# Patient Record
Sex: Female | Born: 1965 | Race: Black or African American | Hispanic: No | Smoking: Never smoker
Health system: Southern US, Community
[De-identification: ages and names within clinical notes are randomized; demographics above are authoritative.]

## PROBLEM LIST (undated history)

## (undated) DIAGNOSIS — Z6833 Body mass index (BMI) 33.0-33.9, adult: Secondary | ICD-10-CM

## (undated) HISTORY — PX: LAPAROSCOPY: SHX197

## (undated) HISTORY — DX: Body mass index (bmi) 33.0-33.9, adult: Z68.33

---

## 2003-01-27 HISTORY — PX: TUBAL LIGATION: SHX77

## 2005-10-12 ENCOUNTER — Ambulatory Visit: Payer: Self-pay | Admitting: Family Medicine

## 2005-11-03 DIAGNOSIS — E669 Obesity, unspecified: Secondary | ICD-10-CM

## 2005-12-24 ENCOUNTER — Encounter: Payer: Self-pay | Admitting: Family Medicine

## 2005-12-24 ENCOUNTER — Ambulatory Visit: Payer: Self-pay | Admitting: Family Medicine

## 2005-12-24 ENCOUNTER — Other Ambulatory Visit: Admission: RE | Admit: 2005-12-24 | Discharge: 2005-12-24 | Payer: Self-pay | Admitting: Family Medicine

## 2005-12-24 LAB — CONVERTED CEMR LAB: Pap Smear: NEGATIVE

## 2005-12-29 ENCOUNTER — Encounter: Payer: Self-pay | Admitting: Family Medicine

## 2006-01-21 ENCOUNTER — Encounter: Payer: Self-pay | Admitting: Family Medicine

## 2006-01-21 ENCOUNTER — Telehealth: Payer: Self-pay | Admitting: Family Medicine

## 2006-03-24 ENCOUNTER — Ambulatory Visit: Payer: Self-pay | Admitting: Family Medicine

## 2006-03-24 DIAGNOSIS — N939 Abnormal uterine and vaginal bleeding, unspecified: Secondary | ICD-10-CM

## 2006-03-24 DIAGNOSIS — N926 Irregular menstruation, unspecified: Secondary | ICD-10-CM | POA: Insufficient documentation

## 2006-12-15 ENCOUNTER — Ambulatory Visit: Payer: Self-pay | Admitting: Family Medicine

## 2006-12-15 DIAGNOSIS — N951 Menopausal and female climacteric states: Secondary | ICD-10-CM | POA: Insufficient documentation

## 2006-12-15 DIAGNOSIS — N912 Amenorrhea, unspecified: Secondary | ICD-10-CM | POA: Insufficient documentation

## 2006-12-15 LAB — CONVERTED CEMR LAB: Beta hcg, urine, semiquantitative: NEGATIVE

## 2006-12-29 ENCOUNTER — Other Ambulatory Visit: Admission: RE | Admit: 2006-12-29 | Discharge: 2006-12-29 | Payer: Self-pay | Admitting: Family Medicine

## 2006-12-29 ENCOUNTER — Encounter: Payer: Self-pay | Admitting: Family Medicine

## 2006-12-29 ENCOUNTER — Ambulatory Visit: Payer: Self-pay | Admitting: Family Medicine

## 2006-12-29 DIAGNOSIS — D239 Other benign neoplasm of skin, unspecified: Secondary | ICD-10-CM | POA: Insufficient documentation

## 2006-12-29 DIAGNOSIS — R5383 Other fatigue: Secondary | ICD-10-CM

## 2006-12-29 DIAGNOSIS — R5381 Other malaise: Secondary | ICD-10-CM

## 2006-12-29 LAB — CONVERTED CEMR LAB
AST: 15 units/L (ref 0–37)
Albumin: 3.9 g/dL (ref 3.5–5.2)
BUN: 14 mg/dL (ref 6–23)
CO2: 21 meq/L (ref 19–32)
Calcium: 8.9 mg/dL (ref 8.4–10.5)
Chloride: 105 meq/L (ref 96–112)
Cholesterol: 119 mg/dL (ref 0–200)
Creatinine, Ser: 0.81 mg/dL (ref 0.40–1.20)
HCT: 42.4 % (ref 36.0–46.0)
HDL: 57 mg/dL (ref >39)
Hemoglobin: 12.7 g/dL (ref 12.0–15.0)
Potassium: 4.6 meq/L (ref 3.5–5.3)
RBC: 5.06 M/uL (ref 3.87–5.11)
RDW: 14.4 % (ref 11.5–15.5)
TSH: 1.014 microintl units/mL (ref 0.350–5.50)
Total CHOL/HDL Ratio: 2.1
WBC: 7.1 10*3/uL (ref 4.0–10.5)

## 2006-12-30 ENCOUNTER — Encounter: Payer: Self-pay | Admitting: Family Medicine

## 2007-01-26 ENCOUNTER — Encounter: Payer: Self-pay | Admitting: Family Medicine

## 2007-02-04 ENCOUNTER — Telehealth (INDEPENDENT_AMBULATORY_CARE_PROVIDER_SITE_OTHER): Payer: Self-pay | Admitting: *Deleted

## 2007-02-16 ENCOUNTER — Encounter: Payer: Self-pay | Admitting: Family Medicine

## 2008-01-05 ENCOUNTER — Other Ambulatory Visit: Admission: RE | Admit: 2008-01-05 | Discharge: 2008-01-05 | Payer: Self-pay | Admitting: Family Medicine

## 2008-01-05 ENCOUNTER — Encounter: Payer: Self-pay | Admitting: Family Medicine

## 2008-01-05 ENCOUNTER — Ambulatory Visit: Payer: Self-pay | Admitting: Family Medicine

## 2008-01-05 LAB — CONVERTED CEMR LAB
ALT: 13 units/L (ref 0–35)
CO2: 21 meq/L (ref 19–32)
Calcium: 8.2 mg/dL — ABNORMAL LOW (ref 8.4–10.5)
Chloride: 106 meq/L (ref 96–112)
Cholesterol: 115 mg/dL (ref 0–200)
Creatinine, Ser: 0.77 mg/dL (ref 0.40–1.20)
Glucose, Bld: 88 mg/dL (ref 70–99)
Sodium: 139 meq/L (ref 135–145)
Total Protein: 7 g/dL (ref 6.0–8.3)
Triglycerides: 56 mg/dL (ref <150)

## 2008-01-09 ENCOUNTER — Encounter: Payer: Self-pay | Admitting: Family Medicine

## 2009-01-07 ENCOUNTER — Encounter: Admission: RE | Admit: 2009-01-07 | Discharge: 2009-01-07 | Payer: Self-pay | Admitting: Family Medicine

## 2009-01-07 ENCOUNTER — Other Ambulatory Visit: Admission: RE | Admit: 2009-01-07 | Discharge: 2009-01-07 | Payer: Self-pay | Admitting: Family Medicine

## 2009-01-07 ENCOUNTER — Ambulatory Visit: Payer: Self-pay | Admitting: Family Medicine

## 2009-01-07 DIAGNOSIS — R635 Abnormal weight gain: Secondary | ICD-10-CM | POA: Insufficient documentation

## 2009-01-08 LAB — CONVERTED CEMR LAB
Albumin: 4 g/dL (ref 3.5–5.2)
BUN: 12 mg/dL (ref 6–23)
Calcium: 9 mg/dL (ref 8.4–10.5)
Chloride: 107 meq/L (ref 96–112)
Cholesterol: 118 mg/dL (ref 0–200)
Creatinine, Ser: 0.79 mg/dL (ref 0.40–1.20)
Glucose, Bld: 88 mg/dL (ref 70–99)
HDL: 49 mg/dL (ref >39)
Potassium: 4.3 meq/L (ref 3.5–5.3)
Total CHOL/HDL Ratio: 2.4
Triglycerides: 61 mg/dL (ref <150)

## 2010-01-08 ENCOUNTER — Encounter
Admission: RE | Admit: 2010-01-08 | Discharge: 2010-01-08 | Payer: Self-pay | Source: Home / Self Care | Attending: Family Medicine | Admitting: Family Medicine

## 2010-01-08 ENCOUNTER — Encounter: Payer: Self-pay | Admitting: Family Medicine

## 2010-01-08 ENCOUNTER — Other Ambulatory Visit
Admission: RE | Admit: 2010-01-08 | Discharge: 2010-01-08 | Payer: Self-pay | Source: Home / Self Care | Admitting: Family Medicine

## 2010-01-08 ENCOUNTER — Ambulatory Visit: Payer: Self-pay | Admitting: Family Medicine

## 2010-01-09 LAB — CONVERTED CEMR LAB
AST: 15 units/L (ref 0–37)
Albumin: 3.9 g/dL (ref 3.5–5.2)
BUN: 14 mg/dL (ref 6–23)
CO2: 26 meq/L (ref 19–32)
Calcium: 8.8 mg/dL (ref 8.4–10.5)
Chloride: 106 meq/L (ref 96–112)
Cholesterol: 122 mg/dL (ref 0–200)
Glucose, Bld: 94 mg/dL (ref 70–99)
HDL: 61 mg/dL (ref >39)
Potassium: 4.8 meq/L (ref 3.5–5.3)
TSH: 0.997 microintl units/mL (ref 0.350–4.500)

## 2010-02-27 NOTE — Assessment & Plan Note (Signed)
Summary: CPE with pap   Vital Signs:  Patient profile:   45 year old female Menstrual status:  irregular Height:      68 inches Weight:      208 pounds BMI:     31.74 O2 Sat:      96 % on Room air Pulse rate:   85 / minute BP sitting:   123 / 81  (left arm) Cuff size:   large  Vitals Entered By: Payton Spark CMA (January 08, 2010 8:43 AM)  O2 Flow:  Room air CC: CPE w/ pap   Primary Care Provider:  Seymour Bars DO  CC:  CPE w/ pap.  History of Present Illness: 45 yo G1P1 perimenopausal female presents for CPE.  Not in any relationships.  Due for pap smear.  Has been forgetting to take her Apri everyday.  Periods are starting to skip 1-2 mos.  Denies heavy periods or lasting > 7 days.  Her mammogram is scheduled today.  Denies fam hx of premature heart dz, colon cancer or breast cancer.  Due for fasting labs.  Thinks her Tdap was done < 10 yrs ago.  Wants flu shot today.  Current Medications (verified): 1)  Apri 0.15-30 Mg-Mcg Tabs (Desogestrel-Ethinyl Estradiol) .Marland Kitchen.. 1 Tab By Mouth Daily As Directed  Allergies (verified): No Known Drug Allergies  Past History:  Past Medical History: Reviewed history from 11/03/2005 and no changes required. G4P1 -- 1 tubal in 2005  Family History: Reviewed history from 12/24/2005 and no changes required. 1 brother and 1 sister alive and healthy  brother died in MVA father died GSW, mother died in MVA  Social History: Reviewed history from 11/03/2005 and no changes required. 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  Has Masters.  Married to [place].  1 daugther in college.  Nonsmoker. Occas ETOH. Does not exercise.  Wt steadily increasing. Eats a lot of fast food.  Review of Systems  The patient denies anorexia, fever, weight loss, weight gain, vision loss, decreased hearing, hoarseness, chest pain, syncope, dyspnea on exertion, peripheral edema, prolonged cough, headaches, hemoptysis, abdominal pain, melena, hematochezia, severe  indigestion/heartburn, hematuria, incontinence, genital sores, muscle weakness, suspicious skin lesions, transient blindness, difficulty walking, depression, unusual weight change, abnormal bleeding, enlarged lymph nodes, angioedema, breast masses, and testicular masses.    Physical Exam  General:  alert, well-developed, well-nourished, and well-hydrated.  obese Head:  normocephalic and atraumatic.   Eyes:  pupils equal, pupils round, and pupils reactive to light.   Ears:  no external deformities.   Nose:  no nasal discharge.   Mouth:  good dentition and pharynx pink and moist.   Neck:  no masses.   Breasts:  No mass, nodules, thickening, tenderness, bulging, retraction, inflamation, nipple discharge or skin changes noted.   Lungs:  Normal respiratory effort, chest expands symmetrically. Lungs are clear to auscultation, no crackles or wheezes. Heart:  Normal rate and regular rhythm. S1 and S2 normal without gallop, murmur, click, rub or other extra sounds. Abdomen:  Bowel sounds positive,abdomen soft and non-tender without masses, organomegaly Genitalia:  Pelvic Exam:        External: normal female genitalia without lesions or masses        Vagina: normal without lesions or masses        Cervix: normal without lesions or masses        Adnexa: normal bimanual exam without masses or fullness        Uterus: normal by palpation  Pap smear: performed Pulses:  2+ radial and pedal pulses Extremities:  no LE edema Skin:  hirsuit, color normal and no edema.   Cervical Nodes:  No lymphadenopathy noted Psych:  good eye contact, not anxious appearing, and not depressed appearing.     Impression & Recommendations:  Problem # 1:  ROUTINE GYNECOLOGICAL EXAMINATION (ICD-V72.31) Keeping healthy checklist for women reviewed. BP at goal. BMI 31 c/w class I obesity. Mammogram scheduled for today. Fasitng labs today. Thin prep pap today. Keeping healthy checklist for women reviewed. Tdap is  historically UTD. Flu shot given. Stop OCPs -- forgetting to take, not having heavy periods and is not sexually active.  Complete Medication List: 1)  Apri 0.15-30 Mg-mcg Tabs (Desogestrel-ethinyl estradiol) .Marland Kitchen.. 1 tab by mouth daily as directed  Other Orders: T-Comprehensive Metabolic Panel [id number]) T-Lipid Profile [id number]) T-TSH [id number]) Admin 1st Vaccine (41324) Flu Vaccine 51yrs + (40102)  Patient Instructions: 1)  Have mammogram and fasting labs drawn. 2)  Will call you w/ results tomorrow. 3)  Stop Birth Control Pills. 4)  Call if periods become heavy or last > 7 days. 5)  Return for next PHYSICAL in 1 yr, sooner if needed for anything.   Orders Added: 1)  T-Comprehensive Metabolic Panel [[id number]] 2)  T-Lipid Profile [[id number]] 3)  T-TSH [[id number]] 4)  Est. Patient age 64-64 [69] 5)  Admin 1st Vaccine [90471] 6)  Flu Vaccine 59yrs + [47425] Flu Vaccine Consent Questions     Do you have a history of severe allergic reactions to this vaccine? no    Any prior history of allergic reactions to egg and/or gelatin? no    Do you have a sensitivity to the preservative Thimersol? no    Do you have a past history of Guillan-Barre Syndrome? no    Do you currently have an acute febrile illness? no    Have you ever had a severe reaction to latex? no    Vaccine information given and explained to patient? yes    Are you currently pregnant? no    Lot Number:[id number]   Exp Date:07/26/2010   Site Given  Left Deltoid IM 11yrs + [95638]     .lbflu

## 2010-04-23 ENCOUNTER — Other Ambulatory Visit: Payer: Self-pay | Admitting: Family Medicine

## 2010-04-23 DIAGNOSIS — Z309 Encounter for contraceptive management, unspecified: Secondary | ICD-10-CM

## 2010-04-23 NOTE — Telephone Encounter (Signed)
Pt tried to DC OCPs but prefers to be on them. Pt requests refill.

## 2010-12-01 ENCOUNTER — Other Ambulatory Visit: Payer: Self-pay | Admitting: Family Medicine

## 2010-12-01 DIAGNOSIS — Z1231 Encounter for screening mammogram for malignant neoplasm of breast: Secondary | ICD-10-CM

## 2011-01-08 ENCOUNTER — Encounter: Payer: Self-pay | Admitting: Family Medicine

## 2011-01-12 ENCOUNTER — Encounter: Payer: Self-pay | Admitting: Family Medicine

## 2011-01-13 ENCOUNTER — Ambulatory Visit (INDEPENDENT_AMBULATORY_CARE_PROVIDER_SITE_OTHER): Payer: BC Managed Care – PPO | Admitting: Family Medicine

## 2011-01-13 ENCOUNTER — Encounter: Payer: Self-pay | Admitting: Family Medicine

## 2011-01-13 ENCOUNTER — Ambulatory Visit
Admission: RE | Admit: 2011-01-13 | Discharge: 2011-01-13 | Disposition: A | Discharge status: Home / Self Care | Payer: Self-pay | Source: Ambulatory Visit | Attending: Family Medicine | Admitting: Family Medicine

## 2011-01-13 ENCOUNTER — Other Ambulatory Visit (HOSPITAL_COMMUNITY)
Admission: RE | Admit: 2011-01-13 | Discharge: 2011-01-13 | Disposition: A | Discharge status: Home / Self Care | Payer: BC Managed Care – PPO | Source: Ambulatory Visit | Attending: Family Medicine | Admitting: Family Medicine

## 2011-01-13 VITALS — BP 134/84 | HR 79 | Ht 68.0 in | Wt 197.0 lb

## 2011-01-13 DIAGNOSIS — Z1231 Encounter for screening mammogram for malignant neoplasm of breast: Secondary | ICD-10-CM

## 2011-01-13 DIAGNOSIS — Z309 Encounter for contraceptive management, unspecified: Secondary | ICD-10-CM

## 2011-01-13 DIAGNOSIS — Z1159 Encounter for screening for other viral diseases: Secondary | ICD-10-CM | POA: Insufficient documentation

## 2011-01-13 DIAGNOSIS — Z01419 Encounter for gynecological examination (general) (routine) without abnormal findings: Secondary | ICD-10-CM

## 2011-01-13 LAB — LIPID PANEL
Total CHOL/HDL Ratio: 1.8 Ratio
VLDL: 17 mg/dL (ref 0–40)

## 2011-01-13 LAB — COMPLETE METABOLIC PANEL WITH GFR
AST: 13 U/L (ref 0–37)
Albumin: 3.6 g/dL (ref 3.5–5.2)
Alkaline Phosphatase: 66 U/L (ref 39–117)
Chloride: 106 mEq/L (ref 96–112)
GFR, Est Non African American: 87 mL/min
Glucose, Bld: 87 mg/dL (ref 70–99)
Potassium: 4.6 mEq/L (ref 3.5–5.3)
Sodium: 139 mEq/L (ref 135–145)
Total Protein: 7.1 g/dL (ref 6.0–8.3)

## 2011-01-13 MED ORDER — DESOGESTREL-ETHINYL ESTRADIOL 0.15-30 MG-MCG PO TABS: 1.0000 | ORAL_TABLET | Freq: Every day | ORAL | Status: DC

## 2011-01-13 NOTE — Progress Notes (Signed)
  Subjective:     Courtney Daniels is a 45 y.o. female and is here for a comprehensive physical exam. The patient reports no problems.  History   Social History  . Marital Status: Married    Spouse Name: N/A    Number of Children: 1  . Years of Education: MA   Occupational History  . Quality development specialist    Social History Main Topics  . Smoking status: Never Smoker   . Smokeless tobacco: Not on file  . Alcohol Use: 1.0 oz/week    2 drink(s) per week  . Drug Use: No  . Sexually Active: Not on file   Other Topics Concern  . Not on file   Social History Narrative   Divorced from [place].  No exercise.     Health Maintenance  Topic Date Due  . Influenza Vaccine  10/27/2011  . Pap Smear  01/08/2013  . Tetanus/tdap  01/26/2014    The following portions of the patient's history were reviewed and updated as appropriate: allergies, current medications, past family history, past medical history, past social history, past surgical history and problem list.  Review of Systems A comprehensive review of systems was negative.   Objective:    BP 134/84  Pulse 79  Ht 5\' 8"  (1.727 m)  Wt 197 lb (89.359 kg)  BMI 29.95 kg/m2  LMP 12/24/2010 General appearance: alert, cooperative and appears stated age Head: Normocephalic, without obvious abnormality, atraumatic Eyes: conj clear, EOMi, PEERLA Ears: normal TM's and external ear canals both ears Nose: Nares normal. Septum midline. Mucosa normal. No drainage or sinus tenderness. Throat: lips, mucosa, and tongue normal; teeth and gums normal Neck: no adenopathy, no carotid bruit, no JVD, supple, symmetrical, trachea midline and thyroid not enlarged, symmetric, no tenderness/mass/nodules Back: symmetric, no curvature. ROM normal. No CVA tenderness. Lungs: clear to auscultation bilaterally Breasts: normal appearance, no masses or tenderness Heart: regular rate and rhythm, S1, S2 normal, no murmur, click, rub or gallop Abdomen:  soft, non-tender; bowel sounds normal; no masses,  no organomegaly Pelvic: cervix normal in appearance, external genitalia normal, no adnexal masses or tenderness, no cervical motion tenderness, rectovaginal septum normal, uterus normal size, shape, and consistency and vagina normal without discharge Extremities: extremities normal, atraumatic, no cyanosis or edema Pulses: 2+ and symmetric Skin: Skin color, texture, turgor normal. No rashes or lesions Lymph nodes: Cervical, supraclavicular, and axillary nodes normal. Neurologic: Grossly normal    Assessment:    Healthy female exam.      Plan:     See After Visit Summary for Counseling Recommendations  Start a regular exercise program and make sure you are eating a healthy diet Try to eat 4 servings of dairy a day or take a calcium supplement (500mg  twice a day). Your vaccines are up to date.  REfilled meds Due fore screening labs.

## 2011-01-13 NOTE — Patient Instructions (Signed)
Start a regular exercise program and make sure you are eating a healthy diet Try to eat 4 servings of dairy a day or take a calcium supplement (500mg twice a day). Your vaccines are up to date.   

## 2011-12-18 ENCOUNTER — Other Ambulatory Visit: Payer: Self-pay | Admitting: Family Medicine

## 2011-12-18 DIAGNOSIS — Z139 Encounter for screening, unspecified: Secondary | ICD-10-CM

## 2012-01-15 ENCOUNTER — Ambulatory Visit (INDEPENDENT_AMBULATORY_CARE_PROVIDER_SITE_OTHER): Payer: BC Managed Care – PPO | Admitting: Family Medicine

## 2012-01-15 ENCOUNTER — Encounter: Payer: Self-pay | Admitting: Family Medicine

## 2012-01-15 VITALS — BP 144/93 | HR 87 | Ht 67.0 in | Wt 204.0 lb

## 2012-01-15 DIAGNOSIS — Z23 Encounter for immunization: Secondary | ICD-10-CM

## 2012-01-15 DIAGNOSIS — Z0000 Encounter for general adult medical examination without abnormal findings: Secondary | ICD-10-CM

## 2012-01-15 DIAGNOSIS — Z202 Contact with and (suspected) exposure to infections with a predominantly sexual mode of transmission: Secondary | ICD-10-CM

## 2012-01-15 NOTE — Addendum Note (Signed)
Addended by: Judie Petit A on: 01/15/2012 03:57 PM   Modules accepted: Orders

## 2012-01-15 NOTE — Progress Notes (Signed)
  Subjective:     Courtney Daniels is a 46 y.o. female and is here for a comprehensive physical exam. The patient reports no problems. Says plasn on joining the gym and wants to lose about 50 lbs.    History   Social History  . Marital Status: Married    Spouse Name: N/A    Number of Children: 1  . Years of Education: MA   Occupational History  . Quality development specialist    Social History Main Topics  . Smoking status: Never Smoker   . Smokeless tobacco: Not on file  . Alcohol Use: 1.0 oz/week    2 drink(s) per week  . Drug Use: No  . Sexually Active: Not on file   Other Topics Concern  . Not on file   Social History Narrative   Divorced from [place].  No exercise.     Health Maintenance  Topic Date Due  . Influenza Vaccine  09/26/2012  . Pap Smear  01/12/2014  . Tetanus/tdap  01/26/2014    The following portions of the patient's history were reviewed and updated as appropriate: allergies, current medications, past family history, past medical history, past social history, past surgical history and problem list.  Review of Systems A comprehensive review of systems was negative.   Objective:    BP 144/93  Pulse 87  Ht 5\' 7"  (1.702 m)  Wt 204 lb (92.534 kg)  BMI 31.95 kg/m2 General appearance: alert, cooperative and appears stated age Head: Normocephalic, without obvious abnormality, atraumatic Eyes: conj clear, EOMi, PEERLa Ears: normal TM's and external ear canals both ears Nose: Nares normal. Septum midline. Mucosa normal. No drainage or sinus tenderness. Throat: lips, mucosa, and tongue normal; teeth and gums normal Neck: no adenopathy, no carotid bruit, no JVD, supple, symmetrical, trachea midline and thyroid not enlarged, symmetric, no tenderness/mass/nodules Back: symmetric, no curvature. ROM normal. No CVA tenderness. Lungs: clear to auscultation bilaterally Breasts: normal appearance, no masses or tenderness Heart: regular rate and rhythm, S1, S2 normal,  no murmur, click, rub or gallop Abdomen: soft, non-tender; bowel sounds normal; no masses,  no organomegaly Extremities: extremities normal, atraumatic, no cyanosis or edema Pulses: 2+ and symmetric Skin: Skin color, texture, turgor normal. No rashes or lesions Lymph nodes: Cervical, supraclavicular, and axillary nodes normal. Neurologic: Alert and oriented X 3, normal strength and tone. Normal symmetric reflexes. Normal coordination and gait     Assessment:    Healthy female exam.      Plan:     See After Visit Summary for Counseling Recommendations  Keep up a regular exercise program and make sure you are eating a healthy diet Try to eat 4 servings of dairy a day, or if you are lactose intolerant take a calcium with vitamin D daily.  Your vaccines are up to date.  Mammogram is scheduled for Monday. Given lab slip for CMP and fasting lipid panel that she can have done next week when she is fasting. Vaccine given today. Pap smear is up-to-date. We'll repeat next year. Did also do STD testing should she has a new sexual partner.

## 2012-01-15 NOTE — Patient Instructions (Signed)
Keep up a regular exercise program and make sure you are eating a healthy diet Try to eat 4 servings of dairy a day, or if you are lactose intolerant take a calcium with vitamin D daily.  Your vaccines are up to date.   

## 2012-01-19 ENCOUNTER — Ambulatory Visit (INDEPENDENT_AMBULATORY_CARE_PROVIDER_SITE_OTHER): Payer: BC Managed Care – PPO

## 2012-01-19 DIAGNOSIS — Z139 Encounter for screening, unspecified: Secondary | ICD-10-CM

## 2012-01-19 DIAGNOSIS — Z1231 Encounter for screening mammogram for malignant neoplasm of breast: Secondary | ICD-10-CM

## 2012-01-20 LAB — LIPID PANEL
Total CHOL/HDL Ratio: 2 Ratio
VLDL: 13 mg/dL (ref 0–40)

## 2012-01-20 LAB — COMPLETE METABOLIC PANEL WITH GFR
AST: 18 U/L (ref 0–37)
Albumin: 3.9 g/dL (ref 3.5–5.2)
Alkaline Phosphatase: 80 U/L (ref 39–117)
Potassium: 4.1 mEq/L (ref 3.5–5.3)
Sodium: 137 mEq/L (ref 135–145)
Total Protein: 6.8 g/dL (ref 6.0–8.3)

## 2012-01-20 LAB — RPR

## 2012-01-21 LAB — GC/CHLAMYDIA PROBE AMP, URINE: Chlamydia, Swab/Urine, PCR: NEGATIVE

## 2012-10-28 ENCOUNTER — Ambulatory Visit (INDEPENDENT_AMBULATORY_CARE_PROVIDER_SITE_OTHER): Payer: BC Managed Care – PPO | Admitting: Family Medicine

## 2012-10-28 ENCOUNTER — Other Ambulatory Visit (HOSPITAL_COMMUNITY)
Admission: RE | Admit: 2012-10-28 | Discharge: 2012-10-28 | Disposition: A | Discharge status: Home / Self Care | Payer: BC Managed Care – PPO | Source: Ambulatory Visit | Attending: Family Medicine | Admitting: Family Medicine

## 2012-10-28 ENCOUNTER — Encounter: Payer: Self-pay | Admitting: Family Medicine

## 2012-10-28 VITALS — BP 129/79 | HR 65 | Wt 210.0 lb

## 2012-10-28 DIAGNOSIS — Z113 Encounter for screening for infections with a predominantly sexual mode of transmission: Secondary | ICD-10-CM | POA: Insufficient documentation

## 2012-10-28 DIAGNOSIS — Z01419 Encounter for gynecological examination (general) (routine) without abnormal findings: Secondary | ICD-10-CM | POA: Insufficient documentation

## 2012-10-28 DIAGNOSIS — N920 Excessive and frequent menstruation with regular cycle: Secondary | ICD-10-CM

## 2012-10-28 DIAGNOSIS — N926 Irregular menstruation, unspecified: Secondary | ICD-10-CM

## 2012-10-28 NOTE — Progress Notes (Signed)
  Subjective:    Patient ID: Courtney Daniels, female    DOB: 06-23-1965, 47 y.o.   MRN: [id number]  HPI Her  Periods are getting heavier and longer. She is now passing clots.  Has happened with the last 2 periods.  She says period lasted 8-9 days. Normally only last 4 days.   Bother sisters went through menopause in their 76s. Never smoker. Starter her OCP about a year ago and periods have been irregular since then.  LMP was 10/16/12. Only stopped for about 2 days and then started again.  Last pap was 2 yr ago in 12/2010.  No new sexual partner. No vaginal d/c, odor or pelvic pain.  No worsening or alleviating factors. No significant pelvic discomfort or pain.   Review of Systems  BP 129/79  Pulse 65  Wt 210 lb (95.255 kg)  BMI 32.88 kg/m2  LMP 10/16/2012    No Known Allergies  Past Medical History  Diagnosis Date  . Ectopic pregnancy 2005    Past Surgical History  Procedure Laterality Date  . Tubal ligation  2005    Left only    History   Social History  . Marital Status: Married    Spouse Name: N/A    Number of Children: 1  . Years of Education: MA   Occupational History  . Quality development specialist    Social History Main Topics  . Smoking status: Never Smoker   . Smokeless tobacco: Not on file  . Alcohol Use: 1.0 oz/week    2 drink(s) per week  . Drug Use: No  . Sexual Activity: Not on file   Other Topics Concern  . Not on file   Social History Narrative   Divorced from [place].  No exercise.      Family History  Problem Relation Age of Onset  . Cataracts Maternal Grandmother   . Diabetes Maternal Aunt   . Diabetes      fathers side    No outpatient encounter prescriptions on file as of 10/28/2012.   No facility-administered encounter medications on file as of 10/28/2012.          Objective:   Physical Exam  Constitutional: She is oriented to person, place, and time. She appears well-developed and well-nourished.  HENT:  Head: Normocephalic  and atraumatic.  Genitourinary: Vagina normal and uterus normal. There is no rash, tenderness, lesion or injury on the right labia. There is no rash, tenderness, lesion or injury on the left labia. Cervix exhibits no motion tenderness, no discharge and no friability. Right adnexum displays no mass, no tenderness and no fullness. Left adnexum displays no mass, no tenderness and no fullness.  Neurological: She is alert and oriented to person, place, and time.  Skin: Skin is warm and dry.  Psychiatric: She has a normal mood and affect. Her behavior is normal.          Assessment & Plan:  Menorrhagia - Suspect may be perimenopausal .  Thyroid as well as hormones to evaluate where she may be as far as menopause is concerned. She would like to consider going back on birth control for a short period time just reregulate her cycle. I think that that's not unreasonable. She's never been a smoker. Pap smear performed. We'll call with results once available. Also did a wet prep. GC and Chlamydia performed. Consider pelvic ultrasound if the heavy bleeding continues. So far it only been with 2 cycles. She is a never smoker.

## 2012-10-29 LAB — WET PREP, GENITAL
Clue Cells Wet Prep HPF POC: NONE SEEN
WBC, Wet Prep HPF POC: NONE SEEN

## 2012-10-29 LAB — LUTEINIZING HORMONE: LH: 23.8 m[IU]/mL

## 2012-10-31 ENCOUNTER — Other Ambulatory Visit: Payer: Self-pay | Admitting: Family Medicine

## 2012-10-31 MED ORDER — FLUCONAZOLE 150 MG PO TABS: 150.0000 mg | ORAL_TABLET | Freq: Every day | ORAL | Status: DC

## 2013-01-06 ENCOUNTER — Other Ambulatory Visit: Payer: Self-pay | Admitting: Family Medicine

## 2013-01-06 DIAGNOSIS — Z1231 Encounter for screening mammogram for malignant neoplasm of breast: Secondary | ICD-10-CM

## 2013-01-24 ENCOUNTER — Ambulatory Visit (INDEPENDENT_AMBULATORY_CARE_PROVIDER_SITE_OTHER): Payer: BC Managed Care – PPO

## 2013-01-24 ENCOUNTER — Ambulatory Visit (INDEPENDENT_AMBULATORY_CARE_PROVIDER_SITE_OTHER): Payer: BC Managed Care – PPO | Admitting: Family Medicine

## 2013-01-24 ENCOUNTER — Encounter: Payer: Self-pay | Admitting: Family Medicine

## 2013-01-24 VITALS — BP 130/85 | HR 69 | Temp 97.7°F | Ht 67.0 in | Wt 215.0 lb

## 2013-01-24 DIAGNOSIS — Z1231 Encounter for screening mammogram for malignant neoplasm of breast: Secondary | ICD-10-CM

## 2013-01-24 DIAGNOSIS — Z0000 Encounter for general adult medical examination without abnormal findings: Secondary | ICD-10-CM

## 2013-01-24 NOTE — Patient Instructions (Signed)
Silicone sheets or gel for the keloid. Can treat for 24 weeks.   Keep up a regular exercise program and make sure you are eating a healthy diet Try to eat 4 servings of dairy a day, or if you are lactose intolerant take a calcium with vitamin D daily.  Your vaccines are up to date.

## 2013-01-24 NOTE — Progress Notes (Signed)
  Subjective:     Courtney Daniels is a 47 y.o. female and is here for a comprehensive physical exam. 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 started about na year ago after had a pimple that wouldn't heal.  Had mammo today.   History   Social History  . Marital Status: Married    Spouse Name: N/A    Number of Children: 1  . Years of Education: MA   Occupational History  . Quality development specialist    Social History Main Topics  . Smoking status: Never Smoker   . Smokeless tobacco: Not on file  . Alcohol Use: 1.0 oz/week    2 drink(s) per week  . Drug Use: No  . Sexual Activity: Not on file   Other Topics Concern  . Not on file   Social History Narrative   Divorced from [place].  No exercise.     Health Maintenance  Topic Date Due  . Influenza Vaccine  08/26/2013  . Tetanus/tdap  01/26/2014  . Pap Smear  10/29/2015    The following portions of the patient's history were reviewed and updated as appropriate: allergies, current medications, past family history, past medical history, past social history, past surgical history and problem list.  Review of Systems A comprehensive review of systems was negative.   Objective:    BP 130/85  Pulse 69  Temp(Src) 97.7 F (36.5 C)  Ht 5\' 7"  (1.702 m)  Wt 215 lb (97.523 kg)  BMI 33.67 kg/m2  LMP 01/16/2013 General appearance: alert, cooperative and appears stated age Head: Normocephalic, without obvious abnormality, atraumatic Eyes: conj clear, EOMI, PEERLA Ears: normal TM's and external ear canals both ears Nose: Nares normal. Septum midline. Mucosa normal. No drainage or sinus tenderness. Throat: lips, mucosa, and tongue normal; teeth and gums normal  Cardio: RRR, no murmur Lung: CTA-B Abd: soft, NT, no OGM Extremity: no LE edema, clubbing or cyanosis Neuro: CN 2-12 in tact. Gait is normal. Reflexes 2+ in UE nad LE.  Breast and Pelvic exam not performed    Assessment:    Healthy female  exam.      Plan:     See After Visit Summary for Counseling Recommendations  Keep up a regular exercise program and make sure you are eating a healthy diet Try to eat 4 servings of dairy a day, or if you are lactose intolerant take a calcium with vitamin D daily.  Your vaccines are up to date.  Due for CMP and lipids.   Keloid - discussed tx with silicone sheets or gel at one of most effective tx.   Mammo - pending.

## 2013-01-25 LAB — LIPID PANEL
Cholesterol: 130 mg/dL (ref 0–200)
HDL: 65 mg/dL (ref >39)
LDL Cholesterol: 50 mg/dL (ref 0–99)
Triglycerides: 73 mg/dL (ref <150)

## 2013-01-25 LAB — COMPLETE METABOLIC PANEL WITH GFR
ALT: 18 U/L (ref 0–35)
CO2: 33 mEq/L — ABNORMAL HIGH (ref 19–32)
Creat: 0.69 mg/dL (ref 0.50–1.10)
GFR, Est African American: >89 mL/min
Total Bilirubin: 0.3 mg/dL (ref 0.3–1.2)

## 2013-01-25 NOTE — Progress Notes (Signed)
Quick Note:  All labs are normal. ______ 

## 2014-01-08 ENCOUNTER — Other Ambulatory Visit: Payer: Self-pay | Admitting: Family Medicine

## 2014-01-08 DIAGNOSIS — Z1231 Encounter for screening mammogram for malignant neoplasm of breast: Secondary | ICD-10-CM

## 2014-01-25 ENCOUNTER — Ambulatory Visit (INDEPENDENT_AMBULATORY_CARE_PROVIDER_SITE_OTHER): Payer: BC Managed Care – PPO

## 2014-01-25 DIAGNOSIS — Z1231 Encounter for screening mammogram for malignant neoplasm of breast: Secondary | ICD-10-CM

## 2014-02-07 ENCOUNTER — Ambulatory Visit (INDEPENDENT_AMBULATORY_CARE_PROVIDER_SITE_OTHER): Payer: BC Managed Care – PPO | Admitting: Family Medicine

## 2014-02-07 ENCOUNTER — Encounter: Payer: Self-pay | Admitting: Family Medicine

## 2014-02-07 VITALS — BP 125/77 | HR 86 | Temp 97.5°F | Ht 67.0 in | Wt 213.0 lb

## 2014-02-07 DIAGNOSIS — Z23 Encounter for immunization: Secondary | ICD-10-CM | POA: Diagnosis not present

## 2014-02-07 DIAGNOSIS — Z6833 Body mass index (BMI) 33.0-33.9, adult: Secondary | ICD-10-CM | POA: Diagnosis not present

## 2014-02-07 DIAGNOSIS — Z0000 Encounter for general adult medical examination without abnormal findings: Secondary | ICD-10-CM

## 2014-02-07 LAB — TSH: TSH: 1.068 u[IU]/mL (ref 0.350–4.500)

## 2014-02-07 LAB — COMPLETE METABOLIC PANEL WITH GFR
ALT: 19 U/L (ref 0–35)
AST: 16 U/L (ref 0–37)
Albumin: 3.8 g/dL (ref 3.5–5.2)
Alkaline Phosphatase: 93 U/L (ref 39–117)
BUN: 16 mg/dL (ref 6–23)
CALCIUM: 9.2 mg/dL (ref 8.4–10.5)
CO2: 29 mEq/L (ref 19–32)
CREATININE: 0.84 mg/dL (ref 0.50–1.10)
Chloride: 104 mEq/L (ref 96–112)
GFR, Est African American: >89 mL/min
GFR, Est Non African American: 82 mL/min
Glucose, Bld: 83 mg/dL (ref 70–99)
Potassium: 5 mEq/L (ref 3.5–5.3)
Sodium: 138 mEq/L (ref 135–145)
Total Bilirubin: 0.3 mg/dL (ref 0.2–1.2)
Total Protein: 7.3 g/dL (ref 6.0–8.3)

## 2014-02-07 LAB — LIPID PANEL
CHOL/HDL RATIO: 2.2 ratio
Cholesterol: 124 mg/dL (ref 0–200)
HDL: 56 mg/dL (ref >39)
LDL Cholesterol: 51 mg/dL (ref 0–99)
Triglycerides: 86 mg/dL (ref <150)
VLDL: 17 mg/dL (ref 0–40)

## 2014-02-07 NOTE — Progress Notes (Signed)
  Subjective:     Courtney Daniels is a 49 y.o. female and is here for a comprehensive physical exam. The patient reports no problems. Had mammogram done 2 weeks ago. She is not currently exercising or working out and so she has gained a fair amount of weight. Last tetanus was in 2006. She has not had a flu shot yet this year.  History   Social History  . Marital Status: Married    Spouse Name: N/A    Number of Children: 1  . Years of Education: MA   Occupational History  . Quality development specialist    Social History Main Topics  . Smoking status: Never Smoker   . Smokeless tobacco: Not on file  . Alcohol Use: 1.0 oz/week    2 drink(s) per week  . Drug Use: No  . Sexual Activity:    Partners: Male   Other Topics Concern  . Not on file   Social History Narrative   Divorced from [place].  No exercise.     Health Maintenance  Topic Date Due  . INFLUENZA VACCINE  08/26/2013  . TETANUS/TDAP  01/26/2014  . PAP SMEAR  10/29/2015  . HIV Screening  Completed    The following portions of the patient's history were reviewed and updated as appropriate: allergies, current medications, past family history, past medical history, past social history, past surgical history and problem list.  Review of Systems A comprehensive review of systems was negative.   Objective:    BP 125/77 mmHg  Pulse 86  Temp(Src) 97.5 F (36.4 C)  Ht 5\' 7"  (1.702 m)  Wt 213 lb (96.616 kg)  BMI 33.35 kg/m2  SpO2 99% General appearance: alert, cooperative and appears stated age Head: Normocephalic, without obvious abnormality, atraumatic Eyes: conj clear, EOMI, PEERLA Ears: normal TM's and external ear canals both ears Nose: Nares normal. Septum midline. Mucosa normal. No drainage or sinus tenderness. Throat: lips, mucosa, and tongue normal; teeth and gums normal Neck: no adenopathy, no carotid bruit, no JVD, supple, symmetrical, trachea midline and thyroid not enlarged, symmetric, no  tenderness/mass/nodules Back: symmetric, no curvature. ROM normal. No CVA tenderness. Lungs: clear to auscultation bilaterally Heart: regular rate and rhythm, S1, S2 normal, no murmur, click, rub or gallop Abdomen: soft, non-tender; bowel sounds normal; no masses,  no organomegaly Extremities: extremities normal, atraumatic, no cyanosis or edema Pulses: 2+ and symmetric Skin: Skin color, texture, turgor normal. No rashes or lesions Lymph nodes: Cervical, supraclavicular, and axillary nodes normal. Neurologic: Alert and oriented X 3, normal strength and tone. Normal symmetric reflexes. Normal coordination and gait    Assessment:    Healthy female exam.      Plan:     See After Visit Summary for Counseling Recommendations  Keep up a regular exercise program and make sure you are eating a healthy diet Try to eat 4 servings of dairy a day, or if you are lactose intolerant take a calcium with vitamin D daily.  Your vaccines are up to date.   BMI 33/Obesity-discussed going back on track with diet and exercise.She really needs to lose aobut 30 lbs.    Tdap and flu vaccines given today.

## 2014-02-07 NOTE — Patient Instructions (Signed)
Keep up a regular exercise program and make sure you are eating a healthy diet Try to eat 4 servings of dairy a day, or if you are lactose intolerant take a calcium with vitamin D daily.  Your vaccines are up to date.   

## 2015-01-02 ENCOUNTER — Other Ambulatory Visit: Payer: Self-pay | Admitting: Family Medicine

## 2015-01-02 DIAGNOSIS — Z1231 Encounter for screening mammogram for malignant neoplasm of breast: Secondary | ICD-10-CM

## 2015-01-30 ENCOUNTER — Ambulatory Visit (INDEPENDENT_AMBULATORY_CARE_PROVIDER_SITE_OTHER): Payer: BC Managed Care – PPO

## 2015-01-30 DIAGNOSIS — Z1231 Encounter for screening mammogram for malignant neoplasm of breast: Secondary | ICD-10-CM | POA: Diagnosis not present

## 2015-01-31 ENCOUNTER — Other Ambulatory Visit: Payer: Self-pay | Admitting: Family Medicine

## 2015-01-31 DIAGNOSIS — R928 Other abnormal and inconclusive findings on diagnostic imaging of breast: Secondary | ICD-10-CM

## 2015-02-06 ENCOUNTER — Other Ambulatory Visit: Payer: BC Managed Care – PPO

## 2015-02-11 ENCOUNTER — Ambulatory Visit (INDEPENDENT_AMBULATORY_CARE_PROVIDER_SITE_OTHER): Payer: BC Managed Care – PPO | Admitting: Family Medicine

## 2015-02-11 ENCOUNTER — Ambulatory Visit
Admission: RE | Admit: 2015-02-11 | Discharge: 2015-02-11 | Disposition: A | Discharge status: Home / Self Care | Payer: BC Managed Care – PPO | Source: Ambulatory Visit | Attending: Family Medicine | Admitting: Family Medicine

## 2015-02-11 ENCOUNTER — Encounter: Payer: Self-pay | Admitting: Family Medicine

## 2015-02-11 VITALS — BP 128/83 | HR 92 | Wt 213.0 lb

## 2015-02-11 DIAGNOSIS — Z1211 Encounter for screening for malignant neoplasm of colon: Secondary | ICD-10-CM | POA: Diagnosis not present

## 2015-02-11 DIAGNOSIS — Z113 Encounter for screening for infections with a predominantly sexual mode of transmission: Secondary | ICD-10-CM

## 2015-02-11 DIAGNOSIS — Z23 Encounter for immunization: Secondary | ICD-10-CM | POA: Diagnosis not present

## 2015-02-11 DIAGNOSIS — Z0000 Encounter for general adult medical examination without abnormal findings: Secondary | ICD-10-CM

## 2015-02-11 DIAGNOSIS — R928 Other abnormal and inconclusive findings on diagnostic imaging of breast: Secondary | ICD-10-CM

## 2015-02-11 NOTE — Progress Notes (Signed)
  Subjective:     Courtney Daniels is a 50 y.o. female and is here for a comprehensive physical exam. The patient reports no problems. She is not exercising at this time.  She has gained some weight.  Would like flu shot today.   Social History   Social History  . Marital Status: Married    Spouse Name: N/A  . Number of Children: 1  . Years of Education: MA   Occupational History  . Quality development specialist    Social History Main Topics  . Smoking status: Never Smoker   . Smokeless tobacco: Not on file  . Alcohol Use: 1.0 oz/week    2 drink(s) per week  . Drug Use: No  . Sexual Activity:    Partners: Male   Other Topics Concern  . Not on file   Social History Narrative   Divorced from [place].  No exercise.     Health Maintenance  Topic Date Due  . COLONOSCOPY  07/25/2014  . INFLUENZA VACCINE  08/27/2014  . PAP SMEAR  10/29/2015  . MAMMOGRAM  01/29/2017  . TETANUS/TDAP  02/08/2024  . HIV Screening  Completed    The following portions of the patient's history were reviewed and updated as appropriate: allergies, current medications, past family history, past medical history, past social history, past surgical history and problem list.  Review of Systems A comprehensive review of systems was negative.   Objective:    BP 128/83 mmHg  Pulse 92  Wt 213 lb (96.616 kg)  SpO2 98% General appearance: alert, cooperative and appears stated age Head: Normocephalic, without obvious abnormality, atraumatic Eyes: conj clear, EOMI, PEEERLA Ears: normal TM's and external ear canals both ears Nose: Nares normal. Septum midline. Mucosa normal. No drainage or sinus tenderness. Throat: lips, mucosa, and tongue normal; teeth and gums normal Neck: no adenopathy, no carotid bruit, no JVD, supple, symmetrical, trachea midline and thyroid not enlarged, symmetric, no tenderness/mass/nodules Back: symmetric, no curvature. ROM normal. No CVA tenderness. Lungs: clear to auscultation  bilaterally Heart: regular rate and rhythm, S1, S2 normal, no murmur, click, rub or gallop Abdomen: soft, non-tender; bowel sounds normal; no masses,  no organomegaly Extremities: extremities normal, atraumatic, no cyanosis or edema Pulses: 2+ and symmetric Skin: Skin color, texture, turgor normal. No rashes or lesions Lymph nodes: Cervical, supraclavicular, and axillary nodes normal. Neurologic: Alert and oriented X 3, normal strength and tone. Normal symmetric reflexes. Normal coordination and gait    Assessment:    Healthy female exam.      Plan:     See After Visit Summary for Counseling Recommendations  Keep up a regular exercise program and make sure you are eating a healthy diet Try to eat 4 servings of dairy a day, or if you are lactose intolerant take a calcium with vitamin D daily.  Your vaccines are up to date.  Flu vaccine given today. Lab slip provided. She would like STD testing done as well. Mammogram' came back this morning-normal. Discussed need for screening colonoscopy. Referral placed.

## 2015-02-12 LAB — TSH: TSH: 1.439 u[IU]/mL (ref 0.350–4.500)

## 2015-02-12 LAB — LIPID PANEL
CHOLESTEROL: 128 mg/dL (ref 125–200)
HDL: 52 mg/dL (ref >=46)
LDL Cholesterol: 59 mg/dL (ref <130)
TRIGLYCERIDES: 85 mg/dL (ref <150)
Total CHOL/HDL Ratio: 2.5 Ratio (ref <=5.0)
VLDL: 17 mg/dL (ref <30)

## 2015-02-12 LAB — CBC WITH DIFFERENTIAL/PLATELET
BASOS ABS: 0 10*3/uL (ref 0.0–0.1)
Basophils Relative: 1 % (ref 0–1)
EOS ABS: 0 10*3/uL (ref 0.0–0.7)
EOS PCT: 1 % (ref 0–5)
HEMATOCRIT: 42.1 % (ref 36.0–46.0)
Hemoglobin: 13.6 g/dL (ref 12.0–15.0)
LYMPHS ABS: 2.5 10*3/uL (ref 0.7–4.0)
LYMPHS PCT: 53 % — AB (ref 12–46)
MCH: 26.7 pg (ref 26.0–34.0)
MCHC: 32.3 g/dL (ref 30.0–36.0)
MCV: 82.7 fL (ref 78.0–100.0)
MONO ABS: 0.4 10*3/uL (ref 0.1–1.0)
MPV: 8.6 fL (ref 8.6–12.4)
Monocytes Relative: 9 % (ref 3–12)
Neutro Abs: 1.7 10*3/uL (ref 1.7–7.7)
Neutrophils Relative %: 36 % — ABNORMAL LOW (ref 43–77)
PLATELETS: 354 10*3/uL (ref 150–400)
RBC: 5.09 MIL/uL (ref 3.87–5.11)
RDW: 13 % (ref 11.5–15.5)
WBC: 4.7 10*3/uL (ref 4.0–10.5)

## 2015-02-12 LAB — COMPLETE METABOLIC PANEL WITH GFR
ALBUMIN: 4.1 g/dL (ref 3.6–5.1)
ALK PHOS: 87 U/L (ref 33–130)
ALT: 19 U/L (ref 6–29)
AST: 17 U/L (ref 10–35)
BUN: 12 mg/dL (ref 7–25)
CALCIUM: 8.8 mg/dL (ref 8.6–10.4)
CHLORIDE: 103 mmol/L (ref 98–110)
CO2: 24 mmol/L (ref 20–31)
CREATININE: 0.84 mg/dL (ref 0.50–1.05)
GFR, Est Non African American: 81 mL/min (ref >=60)
Glucose, Bld: 84 mg/dL (ref 65–99)
POTASSIUM: 4.5 mmol/L (ref 3.5–5.3)
Sodium: 137 mmol/L (ref 135–146)
Total Bilirubin: 0.4 mg/dL (ref 0.2–1.2)
Total Protein: 7.3 g/dL (ref 6.1–8.1)

## 2015-02-13 LAB — HEPATITIS C ANTIBODY: HCV Ab: NEGATIVE

## 2015-02-13 LAB — GC/CHLAMYDIA PROBE AMP
CT PROBE, AMP APTIMA: NOT DETECTED
GC PROBE AMP APTIMA: NOT DETECTED

## 2015-02-13 LAB — RPR

## 2015-02-13 LAB — HIV ANTIBODY (ROUTINE TESTING W REFLEX): HIV: NONREACTIVE

## 2015-03-04 LAB — HM COLONOSCOPY

## 2015-03-05 ENCOUNTER — Encounter: Payer: Self-pay | Admitting: Family Medicine

## 2016-01-13 ENCOUNTER — Other Ambulatory Visit: Payer: Self-pay | Admitting: Family Medicine

## 2016-01-13 DIAGNOSIS — Z1239 Encounter for other screening for malignant neoplasm of breast: Secondary | ICD-10-CM

## 2016-02-11 ENCOUNTER — Ambulatory Visit (INDEPENDENT_AMBULATORY_CARE_PROVIDER_SITE_OTHER): Payer: BC Managed Care – PPO

## 2016-02-11 DIAGNOSIS — Z1239 Encounter for other screening for malignant neoplasm of breast: Secondary | ICD-10-CM

## 2016-02-11 DIAGNOSIS — Z1231 Encounter for screening mammogram for malignant neoplasm of breast: Secondary | ICD-10-CM | POA: Diagnosis not present

## 2016-02-17 ENCOUNTER — Encounter: Payer: BC Managed Care – PPO | Admitting: Family Medicine

## 2016-02-18 ENCOUNTER — Encounter: Payer: Self-pay | Admitting: Family Medicine

## 2016-02-18 ENCOUNTER — Ambulatory Visit (INDEPENDENT_AMBULATORY_CARE_PROVIDER_SITE_OTHER): Payer: BC Managed Care – PPO | Admitting: Family Medicine

## 2016-02-18 ENCOUNTER — Other Ambulatory Visit (HOSPITAL_COMMUNITY)
Admission: RE | Admit: 2016-02-18 | Discharge: 2016-02-18 | Disposition: A | Discharge status: Home / Self Care | Payer: BC Managed Care – PPO | Source: Ambulatory Visit | Attending: Family Medicine | Admitting: Family Medicine

## 2016-02-18 VITALS — BP 131/82 | HR 66 | Ht 67.0 in | Wt 211.0 lb

## 2016-02-18 DIAGNOSIS — Z01411 Encounter for gynecological examination (general) (routine) with abnormal findings: Secondary | ICD-10-CM | POA: Diagnosis present

## 2016-02-18 DIAGNOSIS — Z1151 Encounter for screening for human papillomavirus (HPV): Secondary | ICD-10-CM | POA: Insufficient documentation

## 2016-02-18 DIAGNOSIS — R87619 Unspecified abnormal cytological findings in specimens from cervix uteri: Secondary | ICD-10-CM

## 2016-02-18 DIAGNOSIS — R87810 Cervical high risk human papillomavirus (HPV) DNA test positive: Secondary | ICD-10-CM | POA: Insufficient documentation

## 2016-02-18 DIAGNOSIS — Z0000 Encounter for general adult medical examination without abnormal findings: Secondary | ICD-10-CM

## 2016-02-18 NOTE — Patient Instructions (Signed)
Keep up a regular exercise program and make sure you are eating a healthy diet Try to eat 4 servings of dairy a day, or if you are lactose intolerant take a calcium with vitamin D daily.  Your vaccines are up to date.   

## 2016-02-18 NOTE — Progress Notes (Signed)
Subjective:     Courtney Daniels is a 51 y.o. female and is here for a comprehensive physical exam. The patient reports no problems.  Social History   Social History  . Marital status: Married    Spouse name: N/A  . Number of children: 1  . Years of education: MA   Occupational History  . Quality development specialist [employer]   Social History Main Topics  . Smoking status: Never Smoker  . Smokeless tobacco: Not on file  . Alcohol use 1.0 oz/week    2 drink(s) per week  . Drug use: No  . Sexual activity: Yes    Partners: Male   Other Topics Concern  . Not on file   Social History Narrative   Divorced from [place].  No exercise.     Health Maintenance  Topic Date Due  . INFLUENZA VACCINE  08/27/2015  . PAP SMEAR  10/29/2015  . MAMMOGRAM  02/10/2018  . TETANUS/TDAP  02/08/2024  . COLONOSCOPY  03/03/2025  . HIV Screening  Completed    The following portions of the patient's history were reviewed and updated as appropriate: allergies, current medications, past family history, past medical history, past social history, past surgical history and problem list.  Review of Systems A comprehensive review of systems was negative.   Objective:    There were no vitals taken for this visit. General appearance: alert, cooperative and appears stated age Head: Normocephalic, without obvious abnormality, atraumatic Eyes: cionj clear, EOMi, PEERLA Ears: normal TM's and external ear canals both ears Nose: Nares normal. Septum midline. Mucosa normal. No drainage or sinus tenderness. Throat: lips, mucosa, and tongue normal; teeth and gums normal Neck: no adenopathy, no carotid bruit, no JVD, supple, symmetrical, trachea midline and thyroid not enlarged, symmetric, no tenderness/mass/nodules Back: symmetric, no curvature. ROM normal. No CVA tenderness. Lungs: clear to auscultation bilaterally Breasts: normal appearance, no masses or tenderness Heart: regular rate and  rhythm, S1, S2 normal, no murmur, click, rub or gallop Abdomen: soft, non-tender; bowel sounds normal; no masses,  no organomegaly Pelvic: cervix normal in appearance, external genitalia normal, no adnexal masses or tenderness, no cervical motion tenderness, rectovaginal septum normal, uterus normal size, shape, and consistency and vagina normal without discharge Extremities: extremities normal, atraumatic, no cyanosis or edema Pulses: 2+ and symmetric Skin: Skin color, texture, turgor normal. No rashes or lesions Lymph nodes: Cervical, supraclavicular, and axillary nodes normal. Neurologic: Alert and oriented X 3, normal strength and tone. Normal symmetric reflexes. Normal coordination and gait    Assessment:    Healthy female exam.      Plan:     See After Visit Summary for Counseling Recommendations   Keep up a regular exercise program and make sure you are eating a healthy diet Try to eat 4 servings of dairy a day, or if you are lactose intolerant take a calcium with vitamin D daily.  Your vaccines are up to date.

## 2016-02-19 LAB — CBC WITH DIFFERENTIAL/PLATELET
BASOS ABS: 50 {cells}/uL (ref 0–200)
Basophils Relative: 1 %
EOS PCT: 1 %
Eosinophils Absolute: 50 cells/uL (ref 15–500)
HEMATOCRIT: 43 % (ref 35.0–45.0)
HEMOGLOBIN: 13.8 g/dL (ref 11.7–15.5)
LYMPHS ABS: 2900 {cells}/uL (ref 850–3900)
Lymphocytes Relative: 58 %
MCH: 26.5 pg — ABNORMAL LOW (ref 27.0–33.0)
MCHC: 32.1 g/dL (ref 32.0–36.0)
MCV: 82.7 fL (ref 80.0–100.0)
MPV: 8.9 fL (ref 7.5–12.5)
Monocytes Absolute: 450 cells/uL (ref 200–950)
Monocytes Relative: 9 %
NEUTROS PCT: 31 %
Neutro Abs: 1550 cells/uL (ref 1500–7800)
Platelets: 380 10*3/uL (ref 140–400)
RBC: 5.2 MIL/uL — ABNORMAL HIGH (ref 3.80–5.10)
RDW: 13.7 % (ref 11.0–15.0)
WBC: 5 10*3/uL (ref 3.8–10.8)

## 2016-02-19 LAB — COMPLETE METABOLIC PANEL WITH GFR
ALT: 18 U/L (ref 6–29)
AST: 16 U/L (ref 10–35)
Albumin: 4.3 g/dL (ref 3.6–5.1)
Alkaline Phosphatase: 107 U/L (ref 33–130)
BILIRUBIN TOTAL: 0.3 mg/dL (ref 0.2–1.2)
BUN: 12 mg/dL (ref 7–25)
CHLORIDE: 100 mmol/L (ref 98–110)
CO2: 28 mmol/L (ref 20–31)
Calcium: 9.5 mg/dL (ref 8.6–10.4)
Creat: 0.84 mg/dL (ref 0.50–1.05)
GFR, EST NON AFRICAN AMERICAN: 81 mL/min (ref >=60)
Glucose, Bld: 82 mg/dL (ref 65–99)
Potassium: 4.5 mmol/L (ref 3.5–5.3)
Sodium: 139 mmol/L (ref 135–146)
Total Protein: 7.4 g/dL (ref 6.1–8.1)

## 2016-02-19 LAB — LIPID PANEL
CHOLESTEROL: 127 mg/dL (ref <200)
HDL: 52 mg/dL (ref >50)
LDL CALC: 54 mg/dL (ref <100)
TRIGLYCERIDES: 105 mg/dL (ref <150)
Total CHOL/HDL Ratio: 2.4 Ratio (ref <5.0)
VLDL: 21 mg/dL (ref <30)

## 2016-02-26 LAB — CYTOLOGY - PAP
HPV 16/18/45 genotyping: NEGATIVE
HPV: DETECTED — AB

## 2016-02-28 NOTE — Addendum Note (Signed)
Addended by: Teddy Spike on: 02/28/2016 08:10 AM   Modules accepted: Orders

## 2016-03-23 ENCOUNTER — Ambulatory Visit (INDEPENDENT_AMBULATORY_CARE_PROVIDER_SITE_OTHER): Payer: BC Managed Care – PPO | Admitting: Obstetrics & Gynecology

## 2016-03-23 ENCOUNTER — Encounter: Payer: Self-pay | Admitting: Obstetrics & Gynecology

## 2016-03-23 DIAGNOSIS — R87612 Low grade squamous intraepithelial lesion on cytologic smear of cervix (LGSIL): Secondary | ICD-10-CM | POA: Diagnosis not present

## 2016-03-23 DIAGNOSIS — R87613 High grade squamous intraepithelial lesion on cytologic smear of cervix (HGSIL): Secondary | ICD-10-CM | POA: Insufficient documentation

## 2016-03-24 ENCOUNTER — Encounter: Payer: Self-pay | Admitting: *Deleted

## 2016-03-26 ENCOUNTER — Telehealth: Payer: Self-pay | Admitting: *Deleted

## 2016-03-26 NOTE — Progress Notes (Signed)
   Subjective:    Patient ID: Courtney Daniels, female    DOB: 04/07/1965, 51 y.o.   MRN: [id number]  HPI 51 yo AA lady referred her for a LGSIL pap.    Review of Systems     Objective:   Physical Exam Pleasant [id number] Breathing, conversing, and ambulating normally UPT negative, consent signed, time out done Cervix prepped with acetic acid. Transformation zone seen in its entirety. Colpo adequate. Normal colpo. ECC obtained. She tolerated the procedure well.        Assessment & Plan:  If ECC is negative, then pap with cotesting in a year.

## 2016-03-26 NOTE — Telephone Encounter (Signed)
LM on cell phone of neg ECC and will just need a repeat pap in 1 year.

## 2016-03-26 NOTE — Telephone Encounter (Signed)
-----   Message from Emily Filbert, MD sent at 03/26/2016  8:41 AM EST ----- She will need a pap with cotesting in a year as her ECC was normal. Thanks

## 2017-01-08 ENCOUNTER — Other Ambulatory Visit: Payer: Self-pay | Admitting: Family Medicine

## 2017-01-08 DIAGNOSIS — Z1239 Encounter for other screening for malignant neoplasm of breast: Secondary | ICD-10-CM

## 2017-02-12 ENCOUNTER — Ambulatory Visit: Payer: BC Managed Care – PPO

## 2017-02-18 ENCOUNTER — Encounter: Payer: Self-pay | Admitting: Family Medicine

## 2017-02-18 ENCOUNTER — Ambulatory Visit (INDEPENDENT_AMBULATORY_CARE_PROVIDER_SITE_OTHER): Payer: BC Managed Care – PPO | Admitting: Family Medicine

## 2017-02-18 ENCOUNTER — Ambulatory Visit (INDEPENDENT_AMBULATORY_CARE_PROVIDER_SITE_OTHER): Payer: BC Managed Care – PPO

## 2017-02-18 VITALS — BP 132/73 | HR 72 | Ht 67.0 in | Wt 214.0 lb

## 2017-02-18 DIAGNOSIS — Z0000 Encounter for general adult medical examination without abnormal findings: Secondary | ICD-10-CM

## 2017-02-18 DIAGNOSIS — Z1231 Encounter for screening mammogram for malignant neoplasm of breast: Secondary | ICD-10-CM | POA: Diagnosis not present

## 2017-02-18 DIAGNOSIS — Z1239 Encounter for other screening for malignant neoplasm of breast: Secondary | ICD-10-CM

## 2017-02-18 NOTE — Patient Instructions (Addendum)

## 2017-02-18 NOTE — Progress Notes (Signed)
Subjective:     Courtney Daniels is a 52 y.o. female and is here for a comprehensive physical exam. The patient reports no problems.  She is not exercising.    Social History   Socioeconomic History  . Marital status: Married    Spouse name: Not on file  . Number of children: 1  . Years of education: MA  . Highest education level: Not on file  Social Needs  . Financial resource strain: Not on file  . Food insecurity - worry: Not on file  . Food insecurity - inability: Not on file  . Transportation needs - medical: Not on file  . Transportation needs - non-medical: Not on file  Occupational History  . Occupation: Barista: [employer]  Tobacco Use  . Smoking status: Never Smoker  . Smokeless tobacco: Never Used  Substance and Sexual Activity  . Alcohol use: Yes    Alcohol/week: 1.0 oz    Types: 2 Standard drinks or equivalent per week  . Drug use: No  . Sexual activity: Yes    Partners: Male  Other Topics Concern  . Not on file  Social History Narrative   Divorced from [place].  No exercise.     Health Maintenance  Topic Date Due  . INFLUENZA VACCINE  08/26/2016  . MAMMOGRAM  02/10/2018  . PAP SMEAR  02/18/2019  . TETANUS/TDAP  02/08/2024  . COLONOSCOPY  03/03/2025  . HIV Screening  Completed    The following portions of the patient's history were reviewed and updated as appropriate: allergies, current medications, past family history, past medical history, past social history, past surgical history and problem list.  Review of Systems A comprehensive review of systems was negative.   Objective:    BP 132/73   Pulse 72   Ht 5\' 7"  (1.702 m)   Wt 214 lb (97.1 kg)   LMP 01/16/2013   SpO2 97%   BMI 33.52 kg/m  General appearance: alert, cooperative and appears stated age Head: Normocephalic, without obvious abnormality, atraumatic Eyes: conj clear, EOMI, PEERLA Ears: normal TM's and external ear canals both  ears Nose: Nares normal. Septum midline. Mucosa normal. No drainage or sinus tenderness. Throat: lips, mucosa, and tongue normal; teeth and gums normal Neck: no adenopathy, no carotid bruit, no JVD, supple, symmetrical, trachea midline and thyroid not enlarged, symmetric, no tenderness/mass/nodules Back: symmetric, no curvature. ROM normal. No CVA tenderness. Lungs: clear to auscultation bilaterally Breasts: normal appearance, no masses or tenderness Heart: regular rate and rhythm, S1, S2 normal, no murmur, click, rub or gallop Abdomen: soft, non-tender; bowel sounds normal; no masses,  no organomegaly Extremities: extremities normal, atraumatic, no cyanosis or edema Pulses: 2+ and symmetric Skin: Skin color, texture, turgor normal. No rashes or lesions Lymph nodes: Cervical, supraclavicular, and axillary nodes normal. Neurologic: Alert and oriented X 3, normal strength and tone. Normal symmetric reflexes. Normal coordination and gait    Assessment:    Healthy female exam.      Plan:     See After Visit Summary for Counseling Recommendations   Keep up a regular exercise program and make sure you are eating a healthy diet Try to eat 4 servings of dairy a day, or if you are lactose intolerant take a calcium with vitamin D daily.  Your vaccines are up to date.

## 2017-02-19 LAB — CBC
HEMATOCRIT: 41.1 % (ref 35.0–45.0)
HEMOGLOBIN: 13.3 g/dL (ref 11.7–15.5)
MCH: 25.9 pg — AB (ref 27.0–33.0)
MCHC: 32.4 g/dL (ref 32.0–36.0)
MCV: 80 fL (ref 80.0–100.0)
MPV: 8.9 fL (ref 7.5–12.5)
Platelets: 346 10*3/uL (ref 140–400)
RBC: 5.14 10*6/uL — ABNORMAL HIGH (ref 3.80–5.10)
RDW: 12.4 % (ref 11.0–15.0)
WBC: 4.9 10*3/uL (ref 3.8–10.8)

## 2017-02-19 LAB — COMPLETE METABOLIC PANEL WITH GFR
AG Ratio: 1.3 (calc) (ref 1.0–2.5)
ALBUMIN MSPROF: 4.1 g/dL (ref 3.6–5.1)
ALKALINE PHOSPHATASE (APISO): 100 U/L (ref 33–130)
ALT: 16 U/L (ref 6–29)
AST: 16 U/L (ref 10–35)
BILIRUBIN TOTAL: 0.3 mg/dL (ref 0.2–1.2)
BUN: 13 mg/dL (ref 7–25)
CHLORIDE: 104 mmol/L (ref 98–110)
CO2: 27 mmol/L (ref 20–32)
CREATININE: 0.86 mg/dL (ref 0.50–1.05)
Calcium: 9.2 mg/dL (ref 8.6–10.4)
GFR, Est African American: 90 mL/min/{1.73_m2} (ref >=60)
GFR, Est Non African American: 78 mL/min/{1.73_m2} (ref >=60)
GLOBULIN: 3.1 g/dL (ref 1.9–3.7)
Glucose, Bld: 89 mg/dL (ref 65–99)
Potassium: 4.6 mmol/L (ref 3.5–5.3)
SODIUM: 139 mmol/L (ref 135–146)
Total Protein: 7.2 g/dL (ref 6.1–8.1)

## 2017-02-19 LAB — LIPID PANEL
CHOL/HDL RATIO: 2.1 (calc) (ref <5.0)
CHOLESTEROL: 124 mg/dL (ref <200)
HDL: 60 mg/dL (ref >50)
LDL Cholesterol (Calc): 48 mg/dL (calc)
Non-HDL Cholesterol (Calc): 64 mg/dL (calc) (ref <130)
Triglycerides: 77 mg/dL (ref <150)

## 2018-02-03 ENCOUNTER — Other Ambulatory Visit: Payer: Self-pay | Admitting: Family Medicine

## 2018-02-03 DIAGNOSIS — Z1231 Encounter for screening mammogram for malignant neoplasm of breast: Secondary | ICD-10-CM

## 2018-03-02 ENCOUNTER — Ambulatory Visit (INDEPENDENT_AMBULATORY_CARE_PROVIDER_SITE_OTHER): Payer: BC Managed Care – PPO

## 2018-03-02 DIAGNOSIS — Z1231 Encounter for screening mammogram for malignant neoplasm of breast: Secondary | ICD-10-CM | POA: Diagnosis not present

## 2018-03-07 ENCOUNTER — Encounter: Payer: Self-pay | Admitting: Family Medicine

## 2018-03-07 ENCOUNTER — Ambulatory Visit (INDEPENDENT_AMBULATORY_CARE_PROVIDER_SITE_OTHER): Payer: BC Managed Care – PPO | Admitting: Family Medicine

## 2018-03-07 VITALS — BP 134/85 | HR 72 | Ht 67.0 in | Wt 213.0 lb

## 2018-03-07 DIAGNOSIS — Z0000 Encounter for general adult medical examination without abnormal findings: Secondary | ICD-10-CM | POA: Diagnosis not present

## 2018-03-07 NOTE — Patient Instructions (Signed)

## 2018-03-07 NOTE — Progress Notes (Signed)
Subjective:     Courtney Daniels is a 53 y.o. female and is here for a comprehensive physical exam. The patient reports no problems.  She is doing well overall.  She is not currently exercising.  She says sleep is fair.  She declines a flu vaccine today.  She reports that she had her mammogram done last week.  Social History   Socioeconomic History  . Marital status: Married    Spouse name: Not on file  . Number of children: 1  . Years of education: MA  . Highest education level: Not on file  Occupational History  . 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  Social Needs  . Financial resource strain: Not on file  . Food insecurity:    Worry: Not on file    Inability: Not on file  . Transportation needs:    Medical: Not on file    Non-medical: Not on file  Tobacco Use  . Smoking status: Never Smoker  . Smokeless tobacco: Never Used  Substance and Sexual Activity  . Alcohol use: Yes    Alcohol/week: 2.0 standard drinks    Types: 2 Standard drinks or equivalent per week  . Drug use: No  . Sexual activity: Yes    Partners: Male  Lifestyle  . Physical activity:    Days per week: Not on file    Minutes per session: Not on file  . Stress: Not on file  Relationships  . Social connections:    Talks on phone: Not on file    Gets together: Not on file    Attends religious service: Not on file    Active member of club or organization: Not on file    Attends meetings of clubs or organizations: Not on file    Relationship status: Not on file  . Intimate partner violence:    Fear of current or ex partner: Not on file    Emotionally abused: Not on file    Physically abused: Not on file    Forced sexual activity: Not on file  Other Topics Concern  . Not on file  Social History Narrative   Divorced from [place].  No exercise.  Sister of Azalee Course.    Health Maintenance  Topic Date Due  . INFLUENZA VACCINE  04/26/2018 (Originally  08/26/2017)  . PAP SMEAR-Modifier  02/18/2019  . MAMMOGRAM  03/02/2020  . TETANUS/TDAP  02/08/2024  . COLONOSCOPY  03/03/2025  . HIV Screening  Completed    The following portions of the patient's history were reviewed and updated as appropriate: allergies, current medications, past family history, past medical history, past social history, past surgical history and problem list.  Review of Systems A comprehensive review of systems was negative.   Objective:    BP 134/85   Pulse 72   Ht 5\' 7"  (1.702 m)   Wt 213 lb (96.6 kg)   LMP 01/16/2013   SpO2 99%   BMI 33.36 kg/m  General appearance: alert, cooperative and appears stated age Head: Normocephalic, without obvious abnormality, atraumatic Eyes: conj clear, EOMI, PEERLA Ears: normal TM's and external ear canals both ears Nose: Nares normal. Septum midline. Mucosa normal. No drainage or sinus tenderness. Throat: lips, mucosa, and tongue normal; teeth and gums normal Neck: no adenopathy, no carotid bruit, no JVD, supple, symmetrical, trachea midline and thyroid not enlarged, symmetric, no tenderness/mass/nodules Back: symmetric, no curvature. ROM normal. No CVA tenderness. Lungs: clear to auscultation bilaterally Heart:  regular rate and rhythm, S1, S2 normal, no murmur, click, rub or gallop Abdomen: soft, non-tender; bowel sounds normal; no masses,  no organomegaly Extremities: extremities normal, atraumatic, no cyanosis or edema Pulses: 2+ and symmetric Skin: Skin color, texture, turgor normal. No rashes or lesions Lymph nodes: Cervical adenopathy: nl and Supraclavicular adenopathy: nl Neurologic: Alert and oriented X 3, normal strength and tone. Normal symmetric reflexes. Normal coordination and gait    Assessment:    Healthy female exam.      Plan:     See After Visit Summary for Counseling Recommendations   Keep up a regular exercise program and make sure you are eating a healthy diet Try to eat 4 servings of dairy a  day, or if you are lactose intolerant take a calcium with vitamin D daily.  Your vaccines are up to date.  Declined flu vaccine. She will go for labs on Thrusday

## 2018-03-11 LAB — CBC
HEMATOCRIT: 40.7 % (ref 35.0–45.0)
Hemoglobin: 13.6 g/dL (ref 11.7–15.5)
MCH: 27.4 pg (ref 27.0–33.0)
MCHC: 33.4 g/dL (ref 32.0–36.0)
MCV: 81.9 fL (ref 80.0–100.0)
MPV: 9.2 fL (ref 7.5–12.5)
PLATELETS: 351 10*3/uL (ref 140–400)
RBC: 4.97 10*6/uL (ref 3.80–5.10)
RDW: 12.7 % (ref 11.0–15.0)
WBC: 5.5 10*3/uL (ref 3.8–10.8)

## 2018-03-11 LAB — LIPID PANEL
CHOLESTEROL: 119 mg/dL (ref <200)
HDL: 59 mg/dL (ref >=50)
LDL Cholesterol (Calc): 44 mg/dL (calc)
Non-HDL Cholesterol (Calc): 60 mg/dL (calc) (ref <130)
TRIGLYCERIDES: 80 mg/dL (ref <150)
Total CHOL/HDL Ratio: 2 (calc) (ref <5.0)

## 2018-03-11 LAB — COMPLETE METABOLIC PANEL WITH GFR
AG Ratio: 1.3 (calc) (ref 1.0–2.5)
ALBUMIN MSPROF: 4.1 g/dL (ref 3.6–5.1)
ALKALINE PHOSPHATASE (APISO): 105 U/L (ref 37–153)
ALT: 16 U/L (ref 6–29)
AST: 15 U/L (ref 10–35)
BUN: 11 mg/dL (ref 7–25)
CO2: 27 mmol/L (ref 20–32)
CREATININE: 0.86 mg/dL (ref 0.50–1.05)
Calcium: 9 mg/dL (ref 8.6–10.4)
Chloride: 104 mmol/L (ref 98–110)
GFR, Est African American: 89 mL/min/{1.73_m2} (ref >=60)
GFR, Est Non African American: 77 mL/min/{1.73_m2} (ref >=60)
GLOBULIN: 3.2 g/dL (ref 1.9–3.7)
Glucose, Bld: 90 mg/dL (ref 65–99)
Potassium: 4.5 mmol/L (ref 3.5–5.3)
SODIUM: 139 mmol/L (ref 135–146)
Total Bilirubin: 0.4 mg/dL (ref 0.2–1.2)
Total Protein: 7.3 g/dL (ref 6.1–8.1)

## 2018-03-11 NOTE — Progress Notes (Signed)
All labs are normal. 

## 2018-04-28 DIAGNOSIS — Z6833 Body mass index (BMI) 33.0-33.9, adult: Secondary | ICD-10-CM

## 2018-04-28 DIAGNOSIS — E669 Obesity, unspecified: Secondary | ICD-10-CM | POA: Insufficient documentation

## 2018-04-28 HISTORY — DX: Body mass index (BMI) 33.0-33.9, adult: Z68.33

## 2019-03-09 ENCOUNTER — Encounter: Payer: Self-pay | Admitting: Family Medicine

## 2019-03-09 ENCOUNTER — Other Ambulatory Visit (HOSPITAL_COMMUNITY)
Admission: RE | Admit: 2019-03-09 | Discharge: 2019-03-09 | Disposition: A | Discharge status: Home / Self Care | Payer: BC Managed Care – PPO | Source: Ambulatory Visit | Attending: Family Medicine | Admitting: Family Medicine

## 2019-03-09 ENCOUNTER — Other Ambulatory Visit: Payer: Self-pay

## 2019-03-09 ENCOUNTER — Ambulatory Visit (INDEPENDENT_AMBULATORY_CARE_PROVIDER_SITE_OTHER): Payer: BC Managed Care – PPO | Admitting: Family Medicine

## 2019-03-09 ENCOUNTER — Ambulatory Visit (INDEPENDENT_AMBULATORY_CARE_PROVIDER_SITE_OTHER): Payer: BC Managed Care – PPO

## 2019-03-09 VITALS — BP 133/74 | HR 82 | Ht 67.0 in | Wt 211.0 lb

## 2019-03-09 DIAGNOSIS — Z1231 Encounter for screening mammogram for malignant neoplasm of breast: Secondary | ICD-10-CM

## 2019-03-09 DIAGNOSIS — Z01419 Encounter for gynecological examination (general) (routine) without abnormal findings: Secondary | ICD-10-CM | POA: Diagnosis not present

## 2019-03-09 DIAGNOSIS — Z0000 Encounter for general adult medical examination without abnormal findings: Secondary | ICD-10-CM

## 2019-03-09 DIAGNOSIS — Z124 Encounter for screening for malignant neoplasm of cervix: Secondary | ICD-10-CM | POA: Insufficient documentation

## 2019-03-09 LAB — COMPLETE METABOLIC PANEL WITH GFR
AG Ratio: 1.5 (calc) (ref 1.0–2.5)
ALT: 19 U/L (ref 6–29)
AST: 14 U/L (ref 10–35)
Albumin: 4 g/dL (ref 3.6–5.1)
Alkaline phosphatase (APISO): 93 U/L (ref 37–153)
BUN: 13 mg/dL (ref 7–25)
CO2: 29 mmol/L (ref 20–32)
Calcium: 8.9 mg/dL (ref 8.6–10.4)
Chloride: 107 mmol/L (ref 98–110)
Creat: 0.77 mg/dL (ref 0.50–1.05)
GFR, Est African American: 101 mL/min/{1.73_m2} (ref >=60)
GFR, Est Non African American: 88 mL/min/{1.73_m2} (ref >=60)
Globulin: 2.7 g/dL (calc) (ref 1.9–3.7)
Glucose, Bld: 94 mg/dL (ref 65–99)
Potassium: 4.8 mmol/L (ref 3.5–5.3)
Sodium: 140 mmol/L (ref 135–146)
Total Bilirubin: 0.3 mg/dL (ref 0.2–1.2)
Total Protein: 6.7 g/dL (ref 6.1–8.1)

## 2019-03-09 LAB — CBC
HCT: 39.5 % (ref 35.0–45.0)
Hemoglobin: 13.1 g/dL (ref 11.7–15.5)
MCH: 27.5 pg (ref 27.0–33.0)
MCHC: 33.2 g/dL (ref 32.0–36.0)
MCV: 83 fL (ref 80.0–100.0)
MPV: 9.2 fL (ref 7.5–12.5)
Platelets: 354 10*3/uL (ref 140–400)
RBC: 4.76 10*6/uL (ref 3.80–5.10)
RDW: 12.4 % (ref 11.0–15.0)
WBC: 4.2 10*3/uL (ref 3.8–10.8)

## 2019-03-09 LAB — LIPID PANEL W/REFLEX DIRECT LDL
Cholesterol: 118 mg/dL (ref <200)
HDL: 55 mg/dL (ref >=50)
LDL Cholesterol (Calc): 49 mg/dL (calc)
Non-HDL Cholesterol (Calc): 63 mg/dL (calc) (ref <130)
Total CHOL/HDL Ratio: 2.1 (calc) (ref <5.0)
Triglycerides: 59 mg/dL (ref <150)

## 2019-03-09 NOTE — Patient Instructions (Signed)
Health Maintenance, Female Adopting a healthy lifestyle and getting preventive care are important in promoting health and wellness. Ask your health care provider about:  The right schedule for you to have regular tests and exams.  Things you can do on your own to prevent diseases and keep yourself healthy. What should I know about diet, weight, and exercise? Eat a healthy diet   Eat a diet that includes plenty of vegetables, fruits, low-fat dairy products, and lean protein.  Do not eat a lot of foods that are high in solid fats, added sugars, or sodium. Maintain a healthy weight Body mass index (BMI) is used to identify weight problems. It estimates body fat based on height and weight. Your health care provider can help determine your BMI and help you achieve or maintain a healthy weight. Get regular exercise Get regular exercise. This is one of the most important things you can do for your health. Most adults should:  Exercise for at least 150 minutes each week. The exercise should increase your heart rate and make you sweat (moderate-intensity exercise).  Do strengthening exercises at least twice a week. This is in addition to the moderate-intensity exercise.  Spend less time sitting. Even light physical activity can be beneficial. Watch cholesterol and blood lipids Have your blood tested for lipids and cholesterol at 55 years of age, then have this test every 5 years. Have your cholesterol levels checked more often if:  Your lipid or cholesterol levels are high.  You are older than 55 years of age.  You are at high risk for heart disease. What should I know about cancer screening? Depending on your health history and family history, you may need to have cancer screening at various ages. This may include screening for:  Breast cancer.  Cervical cancer.  Colorectal cancer.  Skin cancer.  Lung cancer. What should I know about heart disease, diabetes, and high blood  pressure? Blood pressure and heart disease  High blood pressure causes heart disease and increases the risk of stroke. This is more likely to develop in people who have high blood pressure readings, are of African descent, or are overweight.  Have your blood pressure checked: ? Every 3-5 years if you are 18-39 years of age. ? Every year if you are 40 years old or older. Diabetes Have regular diabetes screenings. This checks your fasting blood sugar level. Have the screening done:  Once every three years after age 40 if you are at a normal weight and have a low risk for diabetes.  More often and at a younger age if you are overweight or have a high risk for diabetes. What should I know about preventing infection? Hepatitis B If you have a higher risk for hepatitis B, you should be screened for this virus. Talk with your health care provider to find out if you are at risk for hepatitis B infection. Hepatitis C Testing is recommended for:  Everyone born from 1945 through 1965.  Anyone with known risk factors for hepatitis C. Sexually transmitted infections (STIs)  Get screened for STIs, including gonorrhea and chlamydia, if: ? You are sexually active and are younger than 55 years of age. ? You are older than 55 years of age and your health care provider tells you that you are at risk for this type of infection. ? Your sexual activity has changed since you were last screened, and you are at increased risk for chlamydia or gonorrhea. Ask your health care provider if   you are at risk.  Ask your health care provider about whether you are at high risk for HIV. Your health care provider may recommend a prescription medicine to help prevent HIV infection. If you choose to take medicine to prevent HIV, you should first get tested for HIV. You should then be tested every 3 months for as long as you are taking the medicine. Pregnancy  If you are about to stop having your period (premenopausal) and  you may become pregnant, seek counseling before you get pregnant.  Take 400 to 800 micrograms (mcg) of folic acid every day if you become pregnant.  Ask for birth control (contraception) if you want to prevent pregnancy. Osteoporosis and menopause Osteoporosis is a disease in which the bones lose minerals and strength with aging. This can result in bone fractures. If you are 65 years old or older, or if you are at risk for osteoporosis and fractures, ask your health care provider if you should:  Be screened for bone loss.  Take a calcium or vitamin D supplement to lower your risk of fractures.  Be given hormone replacement therapy (HRT) to treat symptoms of menopause. Follow these instructions at home: Lifestyle  Do not use any products that contain nicotine or tobacco, such as cigarettes, e-cigarettes, and chewing tobacco. If you need help quitting, ask your health care provider.  Do not use street drugs.  Do not share needles.  Ask your health care provider for help if you need support or information about quitting drugs. Alcohol use  Do not drink alcohol if: ? Your health care provider tells you not to drink. ? You are pregnant, may be pregnant, or are planning to become pregnant.  If you drink alcohol: ? Limit how much you use to 0-1 drink a day. ? Limit intake if you are breastfeeding.  Be aware of how much alcohol is in your drink. In the [place] one drink equals one 12 oz bottle of beer (355 mL), one 5 oz glass of wine (148 mL), or one 1 oz glass of hard liquor (44 mL). General instructions  Schedule regular health, dental, and eye exams.  Stay current with your vaccines.  Tell your health care provider if: ? You often feel depressed. ? You have ever been abused or do not feel safe at home. Summary  Adopting a healthy lifestyle and getting preventive care are important in promoting health and wellness.  Follow your health care provider's instructions about healthy  diet, exercising, and getting tested or screened for diseases.  Follow your health care provider's instructions on monitoring your cholesterol and blood pressure. This information is not intended to replace advice given to you by your health care provider. Make sure you discuss any questions you have with your health care provider. Document Revised: 01/05/2018 Document Reviewed: 01/05/2018 Elsevier Patient Education  2020 Elsevier Inc.  

## 2019-03-09 NOTE — Progress Notes (Signed)
Subjective:     Courtney Daniels is a 54 y.o. female and is here for a comprehensive physical exam. The patient reports no problems.  She is doing well overall she has been working at home since [employer] hit.  She said she rather recheck in the office.  She admits she has not been nearly as active she is not currently exercising.  Social History   Socioeconomic History  . Marital status: Married    Spouse name: Not on file  . Number of children: 1  . Years of education: MA  . Highest education level: Not on file  Occupational History  . 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  Tobacco Use  . Smoking status: Never Smoker  . Smokeless tobacco: Never Used  Substance and Sexual Activity  . Alcohol use: Yes    Alcohol/week: 2.0 standard drinks    Types: 2 Standard drinks or equivalent per week  . Drug use: No  . Sexual activity: Yes    Partners: Male  Other Topics Concern  . Not on file  Social History Narrative   Divorced from [place].  No exercise.  Sister of Azalee Course.    Social Determinants of Health   Financial Resource Strain:   . Difficulty of Paying Living Expenses: Not on file  Food Insecurity:   . Worried About Charity fundraiser in the Last Year: Not on file  . Ran Out of Food in the Last Year: Not on file  Transportation Needs:   . Lack of Transportation (Medical): Not on file  . Lack of Transportation (Non-Medical): Not on file  Physical Activity:   . Days of Exercise per Week: Not on file  . Minutes of Exercise per Session: Not on file  Stress:   . Feeling of Stress : Not on file  Social Connections:   . Frequency of Communication with Friends and Family: Not on file  . Frequency of Social Gatherings with Friends and Family: Not on file  . Attends Religious Services: Not on file  . Active Member of Clubs or Organizations: Not on file  . Attends Archivist Meetings: Not on file  . Marital Status: Not on file   Intimate Partner Violence:   . Fear of Current or Ex-Partner: Not on file  . Emotionally Abused: Not on file  . Physically Abused: Not on file  . Sexually Abused: Not on file   Health Maintenance  Topic Date Due  . PAP SMEAR-Modifier  02/18/2019  . INFLUENZA VACCINE  04/26/2019 (Originally 08/27/2018)  . MAMMOGRAM  03/02/2020  . TETANUS/TDAP  02/08/2024  . COLONOSCOPY  03/03/2025  . HIV Screening  Completed    The following portions of the patient's history were reviewed and updated as appropriate: allergies, current medications, past family history, past medical history, past social history, past surgical history and problem list.  Review of Systems A comprehensive review of systems was negative.   Objective:    BP 133/74   Pulse 82   Ht 5\' 7"  (1.702 m)   Wt 211 lb (95.7 kg)   LMP 01/16/2013   SpO2 100%   BMI 33.05 kg/m  General appearance: alert, cooperative and appears stated age Head: Normocephalic, without obvious abnormality, atraumatic Eyes: conj clear, EOMI,PEERLA Ears: normal TM's and external ear canals both ears Nose: Nares normal. Septum midline. Mucosa normal. No drainage or sinus tenderness. Throat: lips, mucosa, and tongue normal; teeth and gums normal Neck: no  adenopathy, no carotid bruit, no JVD, supple, symmetrical, trachea midline and thyroid not enlarged, symmetric, no tenderness/mass/nodules Back: symmetric, no curvature. ROM normal. No CVA tenderness. Lungs: clear to auscultation bilaterally Breasts: normal appearance, no masses or tenderness Heart: regular rate and rhythm, S1, S2 normal, no murmur, click, rub or gallop Abdomen: soft, non-tender; bowel sounds normal; no masses,  no organomegaly Pelvic: cervix normal in appearance, external genitalia normal, no adnexal masses or tenderness, no cervical motion tenderness, rectovaginal septum normal, uterus normal size, shape, and consistency and vagina normal without discharge Extremities: extremities  normal, atraumatic, no cyanosis or edema Pulses: 2+ and symmetric Skin: Skin color, texture, turgor normal. No rashes or lesions Lymph nodes: Cervical, supraclavicular, and axillary nodes normal. Neurologic: Alert and oriented X 3, normal strength and tone. Normal symmetric reflexes. Normal coordination and gait    Assessment:    Healthy female exam.      Plan:     See After Visit Summary for Counseling Recommendations   Keep up a regular exercise program and make sure you are eating a healthy diet Try to eat 4 servings of dairy a day, or if you are lactose intolerant take a calcium with vitamin D daily.  Your vaccines are up to date.  Due for shingles vaccine as well. Will call with Pap smear results.

## 2019-11-10 ENCOUNTER — Ambulatory Visit (INDEPENDENT_AMBULATORY_CARE_PROVIDER_SITE_OTHER): Payer: BC Managed Care – PPO | Admitting: Nurse Practitioner

## 2019-11-10 ENCOUNTER — Encounter: Payer: Self-pay | Admitting: Nurse Practitioner

## 2019-11-10 VITALS — BP 135/90 | HR 75 | Temp 98.4°F | Ht 67.0 in | Wt 209.5 lb

## 2019-11-10 DIAGNOSIS — H1031 Unspecified acute conjunctivitis, right eye: Secondary | ICD-10-CM

## 2019-11-10 DIAGNOSIS — Z23 Encounter for immunization: Secondary | ICD-10-CM | POA: Diagnosis not present

## 2019-11-10 MED ORDER — CROMOLYN SODIUM 4 % OP SOLN: 2.0000 [drp] | Freq: Four times a day (QID) | OPHTHALMIC | 1 refills | Status: DC

## 2019-11-10 MED ORDER — CROMOLYN SODIUM 4 % OP SOLN: 1.0000 [drp] | Freq: Four times a day (QID) | OPHTHALMIC | 1 refills | Status: DC

## 2019-11-10 MED ORDER — OFLOXACIN 0.3 % OP SOLN: 2.0000 [drp] | Freq: Four times a day (QID) | OPHTHALMIC | 0 refills | Status: DC

## 2019-11-10 NOTE — Progress Notes (Signed)
Acute Office Visit  Subjective:    Patient ID: Courtney Daniels, female    DOB: 09/03/1965, 54 y.o.   MRN: [id number]  Chief Complaint  Patient presents with  . Eye Problem    right eye, onset yesterday, redness, eyelid swelling, itching, watery draining    HPI Courtney Daniels is a pleasant 54 year old female who presents today for redness, watery discharge, eye lid edema, and itching of the right eye that started yesterday morning. She also reports tenderness to the upper eyelid and the right side maxillary sinus area with rhinorrhea. She tells me at the onset of symptoms, the bright light from the sun caused a headache, but this is not longer present.   She is a daily contact lens wearer and has a history of seasonal allergies, but reports nothing like this has ever happened before. She tells me that she took her contacts out yesterday afternoon and started her allergy medication last night (which she takes PRN). She also has been using cool compress to the eye, which is helpful. Her contacts are disposable and she does not wear them to bed. She tells me she has had the windows of her home open due to the cooler weather recently.   This morning she tells me that the rhinorrhea and watering are much improved, but she is still experiencing the itching, redness, burning, and tenderness around the orbit.   Her left eye is not affected.  She denies sore throat, congestion, cough, fever, chills, eye pain, foreign body sensation, sensitivity to light, change in vision, purulent discharge, or difficulty moving the eye or keeping the eye open.   Past Medical History:  Diagnosis Date  . BMI 33.0-33.9,adult 04/28/2018  . Ectopic pregnancy 2005    Past Surgical History:  Procedure Laterality Date  . LAPAROSCOPY Left    salpingectomy  . TUBAL LIGATION  2005   Left only    Family History  Problem Relation Age of Onset  . Cataracts Maternal Grandmother   . Diabetes Maternal Aunt   . Diabetes Other         fathers side  . Cancer Brother 63       oropharyngeal cancer    Social History   Socioeconomic History  . Marital status: Married    Spouse name: Not on file  . Number of children: 1  . Years of education: MA  . Highest education level: Not on file  Occupational History  . Occupation: Barista: [employer]  Tobacco Use  . Smoking status: Never Smoker  . Smokeless tobacco: Never Used  Substance and Sexual Activity  . Alcohol use: Yes    Alcohol/week: 2.0 standard drinks    Types: 2 Standard drinks or equivalent per week  . Drug use: No  . Sexual activity: Yes    Partners: Male  Other Topics Concern  . Not on file  Social History Narrative   Divorced from [place].  No exercise.  Sister of Azalee Course.    Social Determinants of Health   Financial Resource Strain:   . Difficulty of Paying Living Expenses: Not on file  Food Insecurity:   . Worried About Charity fundraiser in the Last Year: Not on file  . Ran Out of Food in the Last Year: Not on file  Transportation Needs:   . Lack of Transportation (Medical): Not on file  . Lack of Transportation (Non-Medical): Not on file  Physical Activity:   .  Days of Exercise per Week: Not on file  . Minutes of Exercise per Session: Not on file  Stress:   . Feeling of Stress : Not on file  Social Connections:   . Frequency of Communication with Friends and Family: Not on file  . Frequency of Social Gatherings with Friends and Family: Not on file  . Attends Religious Services: Not on file  . Active Member of Clubs or Organizations: Not on file  . Attends Archivist Meetings: Not on file  . Marital Status: Not on file  Intimate Partner Violence:   . Fear of Current or Ex-Partner: Not on file  . Emotionally Abused: Not on file  . Physically Abused: Not on file  . Sexually Abused: Not on file    No outpatient medications prior to visit.   No  facility-administered medications prior to visit.    No Known Allergies  Review of Systems See HPI    Objective:    Physical Exam Vitals and nursing note reviewed.  Constitutional:      Appearance: Normal appearance.  HENT:     Head: Normocephalic.     Right Ear: Tympanic membrane normal.     Left Ear: Tympanic membrane normal.     Nose: Nose normal. No rhinorrhea.  Eyes:     General: Vision grossly intact. No visual field deficit.       Right eye: Discharge present. No foreign body.        Left eye: No foreign body or discharge.     Extraocular Movements: Extraocular movements intact.     Right eye: Normal extraocular motion and no nystagmus.     Left eye: Normal extraocular motion and no nystagmus.     Conjunctiva/sclera:     Right eye: Right conjunctiva is injected.     Left eye: Left conjunctiva is not injected.     Pupils: Pupils are equal, round, and reactive to light.     Comments: Injection to the entire conjunctiva of the right eye with mild edema to the periorbital area. Watery exudate noted with no signs of purulent drainage.  No foreign body visualized and no signs of corneal abrasion or ulceration.   Cardiovascular:     Rate and Rhythm: Normal rate and regular rhythm.     Pulses: Normal pulses.     Heart sounds: Normal heart sounds.  Pulmonary:     Effort: Pulmonary effort is normal.     Breath sounds: Normal breath sounds.  Lymphadenopathy:     Cervical: No cervical adenopathy.  Skin:    General: Skin is warm and dry.     Capillary Refill: Capillary refill takes less than 2 seconds.  Neurological:     General: No focal deficit present.     Mental Status: She is alert and oriented to person, place, and time.  Psychiatric:        Mood and Affect: Mood normal.        Behavior: Behavior normal.        Thought Content: Thought content normal.        Judgment: Judgment normal.     BP 135/90   Pulse 75   Temp 98.4 F (36.9 C) (Oral)   Ht 5\' 7"  (1.702  m)   Wt 209 lb 8 oz (95 kg)   LMP 01/16/2013   SpO2 98%   BMI 32.81 kg/m  Wt Readings from Last 3 Encounters:  11/10/19 209 lb 8 oz (95 kg)  03/09/19 211  lb (95.7 kg)  03/07/18 213 lb (96.6 kg)    There are no preventive care reminders to display for this patient.  There are no preventive care reminders to display for this patient.   Lab Results  Component Value Date   TSH 1.439 02/11/2015   Lab Results  Component Value Date   WBC 4.2 03/09/2019   HGB 13.1 03/09/2019   HCT 39.5 03/09/2019   MCV 83.0 03/09/2019   PLT 354 03/09/2019   Lab Results  Component Value Date   NA 140 03/09/2019   K 4.8 03/09/2019   CO2 29 03/09/2019   GLUCOSE 94 03/09/2019   BUN 13 03/09/2019   CREATININE 0.77 03/09/2019   BILITOT 0.3 03/09/2019   ALKPHOS 107 02/18/2016   AST 14 03/09/2019   ALT 19 03/09/2019   PROT 6.7 03/09/2019   ALBUMIN 4.3 02/18/2016   CALCIUM 8.9 03/09/2019   Lab Results  Component Value Date   CHOL 118 03/09/2019   Lab Results  Component Value Date   HDL 55 03/09/2019   Lab Results  Component Value Date   LDLCALC 49 03/09/2019   Lab Results  Component Value Date   TRIG 59 03/09/2019   Lab Results  Component Value Date   CHOLHDL 2.1 03/09/2019   No results found for: HGBA1C     Assessment & Plan:   Problem List Items Addressed This Visit      Other   Acute conjunctivitis of right eye - Primary    Symptoms and presentation consistent with acute conjunctivitis of the right eye.  I suspect an allergic component given the watery discharge and improvement of some symptoms with allergy medication, however, given the symptomology in one eye, we will begin prophylactic treatment with antibiotic ophthalmic drops. No warning signs present today to warrant immediate ophthalmology referral. Recommend ofloxacin 2 drops in right eye 4 times a day for 5 days.  May also use cromolyn ophthalmic drops in both eyes 4 times a day as needed for allergy symptoms.   Warm and cool compresses to the eye may be helpful.  Avoid wearing contacts until all redness has gone away. Disinfect contacts if unable to be replaced and replace contact case before wearing.  Wash hands frequently and avoid touching the eyes to prevent contamination of surfaces.  Discussed warning signs that would warrant immediate evaluation or return to clinic.  Follow-up if symptoms worsen or fail to improve.      Relevant Medications   cromolyn (OPTICROM) 4 % ophthalmic solution   ofloxacin (OCUFLOX) 0.3 % ophthalmic solution    Other Visit Diagnoses    Need for influenza vaccination       Relevant Orders   Flu Vaccine QUAD 36+ mos IM (Completed)       Meds ordered this encounter  Medications  . cromolyn (OPTICROM) 4 % ophthalmic solution    Sig: Place 1 drop into both eyes 4 (four) times daily.    Dispense:  10 mL    Refill:  1  . ofloxacin (OCUFLOX) 0.3 % ophthalmic solution    Sig: Place 2 drops into the right eye 4 (four) times daily.    Dispense:  5 mL    Refill:  0     Orma Render, NP

## 2019-11-10 NOTE — Assessment & Plan Note (Addendum)
Symptoms and presentation consistent with acute conjunctivitis of the right eye.  I suspect an allergic component given the watery discharge and improvement of some symptoms with allergy medication, however, given the symptomology in one eye, we will begin prophylactic treatment with antibiotic ophthalmic drops. No warning signs present today to warrant immediate ophthalmology referral. Recommend ofloxacin 2 drops in right eye 4 times a day for 5 days.  May also use cromolyn ophthalmic drops in both eyes 4 times a day as needed for allergy symptoms.  Warm and cool compresses to the eye may be helpful.  Avoid wearing contacts until all redness has gone away. Disinfect contacts if unable to be replaced and replace contact case before wearing.  Wash hands frequently and avoid touching the eyes to prevent contamination of surfaces.  Discussed warning signs that would warrant immediate evaluation or return to clinic.  Follow-up if symptoms worsen or fail to improve.

## 2019-11-10 NOTE — Addendum Note (Signed)
Addended by: Uyen Eichholz, Clarise Cruz E on: 11/10/2019 04:26 PM   Modules accepted: Orders

## 2019-11-10 NOTE — Patient Instructions (Addendum)
Ofloxacin drops are the antibiotic drops. We will go ahead and use these for 5 days as a prophylactic treatment. These are safe to use with contacts. I would wait until the irritation has improved before trying to wear your contacts again.   Cromolyn drops are the allergy drops for the eye. You can begin to use these in both eye immediately. Wait at least 5 minutes between using the antibiotic drops and these drops to allow the medication time to work. You may continue to use these during your regular allergy seasons to help prevent this from recurring.   Keep taking your oral allergy medication.   Allergic Conjunctivitis A clear membrane (conjunctiva) covers the white part of your eye and the inner surface of your eyelid. Allergic conjunctivitis happens when this membrane has inflammation. This is caused by allergies. Common causes of allergic reactions (allergens) include:  Outdoor allergens, such as: ? Pollen. ? Grass and weeds. ? Mold spores.  Indoor allergens, such as: ? Dust. ? Smoke. ? Mold. ? Pet dander. ? Animal hair. This condition can make your eye red or pink. It can also make your eye feel itchy. This condition cannot be spread from one person to another person (is not contagious). Follow these instructions at home:  Try not to be around things that you are allergic to.  Take or apply over-the-counter and prescription medicines only as told by your doctor. These include any eye drops.  Place a cool, clean washcloth on your eye for 10-20 minutes. Do this 3-4 times a day.  Do not touch or rub your eyes.  Do not wear contact lenses until the inflammation is gone. Wear glasses instead.  Do not wear eye makeup until the inflammation is gone.  Keep all follow-up visits as told by your doctor. This is important. Contact a doctor if:  Your symptoms get worse.  Your symptoms do not get better with treatment.  You have mild eye pain.  You are sensitive to  light,  You have spots or blisters on your eyes.  You have pus coming from your eye.  You have a fever. Get help right away if:  You have redness, swelling, or other symptoms in only one eye.  Your vision is blurry.  You have vision changes.  You have very bad eye pain. Summary  Allergic conjunctivitis is caused by allergies. It can make your eye red or pink, and it can make your eye feel itchy.  This condition cannot be spread from one person to another person (is not contagious).  Try not to be around things that you are allergic to.  Take or apply over-the-counter and prescription medicines only as told by your doctor. These include any eye drops.  Contact your doctor if your symptoms get worse or they do not get better with treatment. This information is not intended to replace advice given to you by your health care provider. Make sure you discuss any questions you have with your health care provider. Document Revised: 05/03/2018 Document Reviewed: 09/06/2015 Elsevier Patient Education  [place].     Influenza (Flu) Vaccine (Inactivated or Recombinant): What You Need to Know 1. Why get vaccinated? Influenza vaccine can prevent influenza (flu). Flu is a contagious disease that spreads around the [place] every year, usually between October and May. Anyone can get the flu, but it is more dangerous for some people. Infants and young children, people 60 years of age and older, pregnant women, and people with  certain health conditions or a weakened immune system are at greatest risk of flu complications. Pneumonia, bronchitis, sinus infections and ear infections are examples of flu-related complications. If you have a medical condition, such as heart disease, cancer or diabetes, flu can make it worse. Flu can cause fever and chills, sore throat, muscle aches, fatigue, cough, headache, and runny or stuffy nose. Some people may have vomiting and diarrhea, though  this is more common in children than adults. Each year thousands of people in the [place] die from flu, and many more are hospitalized. Flu vaccine prevents millions of illnesses and flu-related visits to the doctor each year. 2. Influenza vaccine CDC recommends everyone 86 months of age and older get vaccinated every flu season. Children 6 months through 74 years of age may need 2 doses during a single flu season. Everyone else needs only 1 dose each flu season. It takes about 2 weeks for protection to develop after vaccination. There are many flu viruses, and they are always changing. Each year a new flu vaccine is made to protect against three or four viruses that are likely to cause disease in the upcoming flu season. Even when the vaccine doesn't exactly match these viruses, it may still provide some protection. Influenza vaccine does not cause flu. Influenza vaccine may be given at the same time as other vaccines. 3. Talk with your health care provider Tell your vaccine provider if the person getting the vaccine:  Has had an allergic reaction after a previous dose of influenza vaccine, or has any severe, life-threatening allergies.  Has ever had Guillain-Barr Syndrome (also called GBS). In some cases, your health care provider may decide to postpone influenza vaccination to a future visit. People with minor illnesses, such as a cold, may be vaccinated. People who are moderately or severely ill should usually wait until they recover before getting influenza vaccine. Your health care provider can give you more information. 4. Risks of a vaccine reaction  Soreness, redness, and swelling where shot is given, fever, muscle aches, and headache can happen after influenza vaccine.  There may be a very small increased risk of Guillain-Barr Syndrome (GBS) after inactivated influenza vaccine (the flu shot). Young children who get the flu shot along with pneumococcal vaccine (PCV13), and/or  DTaP vaccine at the same time might be slightly more likely to have a seizure caused by fever. Tell your health care provider if a child who is getting flu vaccine has ever had a seizure. People sometimes faint after medical procedures, including vaccination. Tell your provider if you feel dizzy or have vision changes or ringing in the ears. As with any medicine, there is a very remote chance of a vaccine causing a severe allergic reaction, other serious injury, or death. 5. What if there is a serious problem? An allergic reaction could occur after the vaccinated person leaves the clinic. If you see signs of a severe allergic reaction (hives, swelling of the face and throat, difficulty breathing, a fast heartbeat, dizziness, or weakness), call 9-1-1 and get the person to the nearest hospital. For other signs that concern you, call your health care provider. Adverse reactions should be reported to the Vaccine Adverse Event Reporting System (VAERS). Your health care provider will usually file this report, or you can do it yourself. Visit the VAERS website at www.vaers.SamedayNews.es or call (272) 590-1005.VAERS is only for reporting reactions, and VAERS staff do not give medical advice. 6. The National Vaccine Injury Compensation Program The [employer]  Injury Compensation Program (VICP) is a federal program that was created to compensate people who may have been injured by certain vaccines. Visit the VICP website at GoldCloset.com.ee or call 539-012-9573 to learn about the program and about filing a claim. There is a time limit to file a claim for compensation. 7. How can I learn more?  Ask your healthcare provider.  Call your local or state health department.  Contact the Centers for Disease Control and Prevention (CDC): ? Call (701) 544-6728 (1-800-CDC-INFO) or ? Visit CDC's https://gibson.com/ Vaccine Information Statement (Interim) Inactivated Influenza Vaccine (09/09/2017) This  information is not intended to replace advice given to you by your health care provider. Make sure you discuss any questions you have with your health care provider. Document Revised: 05/03/2018 Document Reviewed: 09/13/2017 Elsevier Patient Education  [place].

## 2019-11-17 ENCOUNTER — Other Ambulatory Visit: Payer: Self-pay | Admitting: Nurse Practitioner

## 2019-11-17 DIAGNOSIS — H1031 Unspecified acute conjunctivitis, right eye: Secondary | ICD-10-CM

## 2020-01-05 IMAGING — MG DIGITAL SCREENING BILATERAL MAMMOGRAM WITH TOMO AND CAD
8 series · 8 of 24 positions shown · non-contrast
Comparison: Previous exam(s).

CLINICAL DATA: Screening.

EXAM:
DIGITAL SCREENING BILATERAL MAMMOGRAM WITH TOMO AND CAD

[L CC synth-2D]
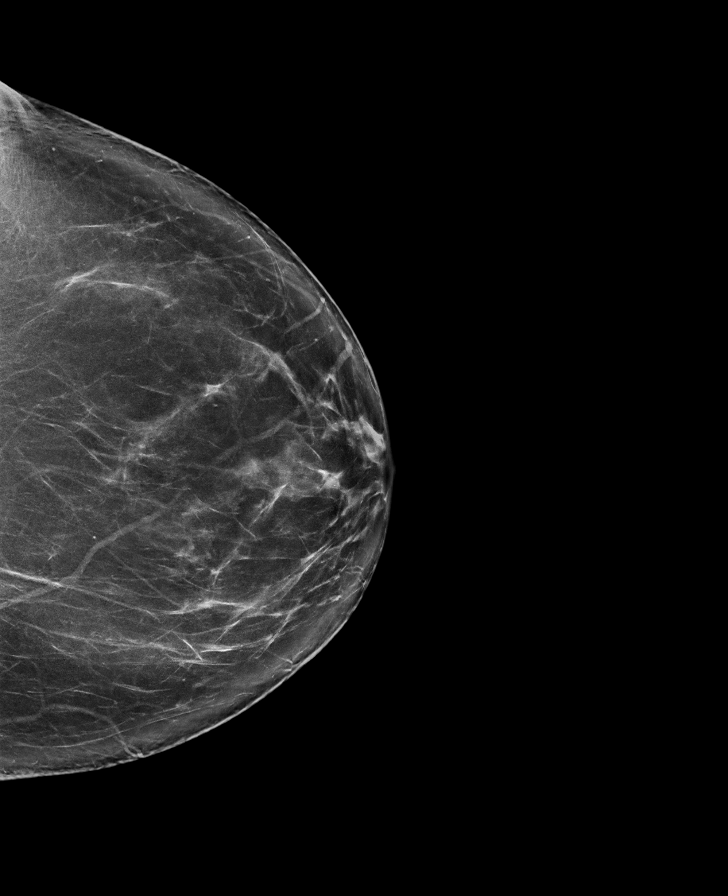

[L MLO synth-2D]
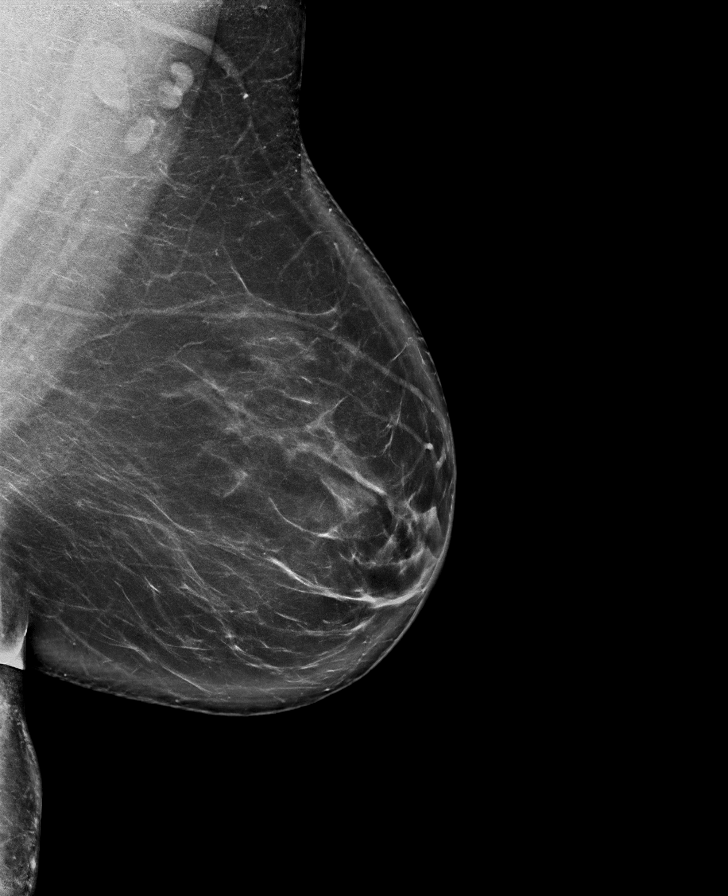

[R MLO synth-2D]
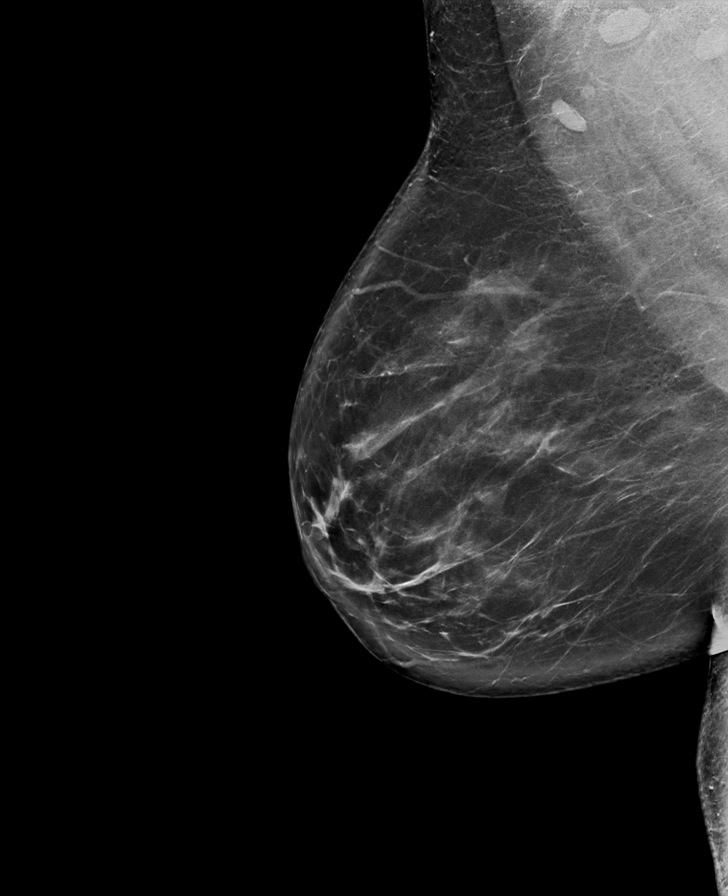

[R CC synth-2D]
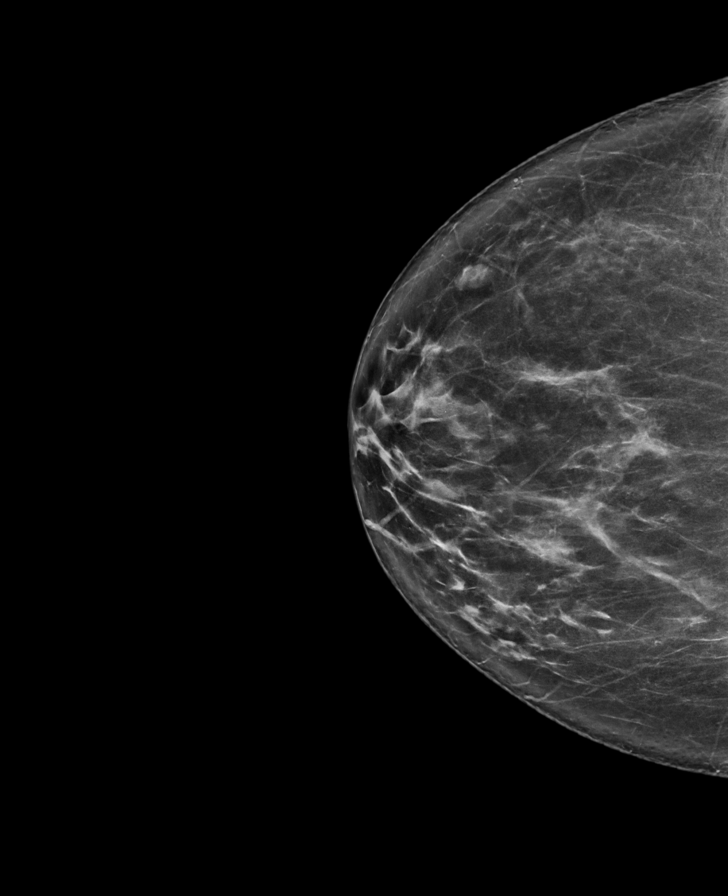

[R MLO tomo · tomo slice 50/99.0]
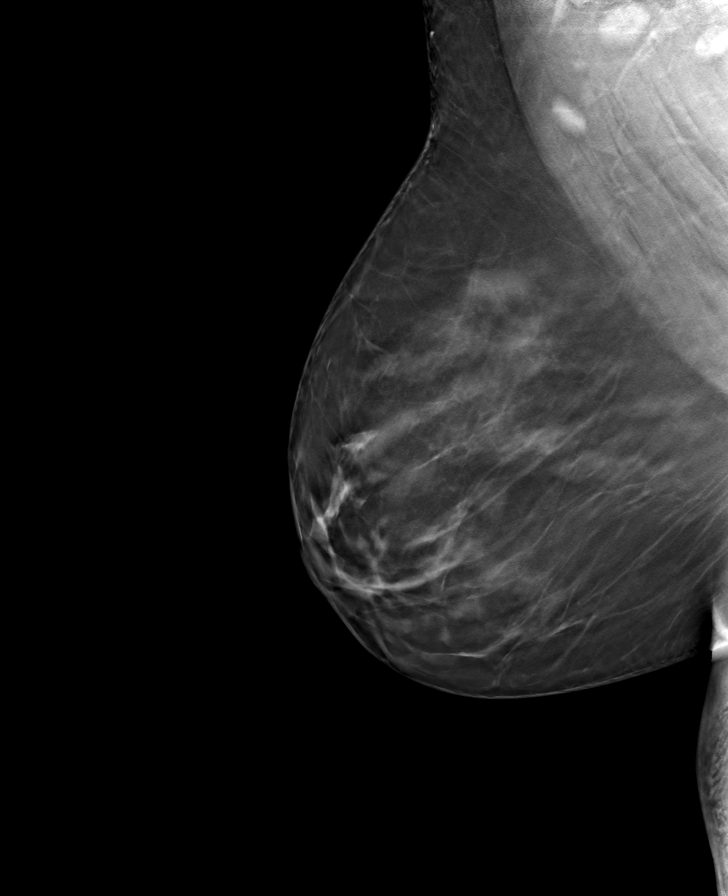

[L MLO tomo · tomo slice 51/102.0]
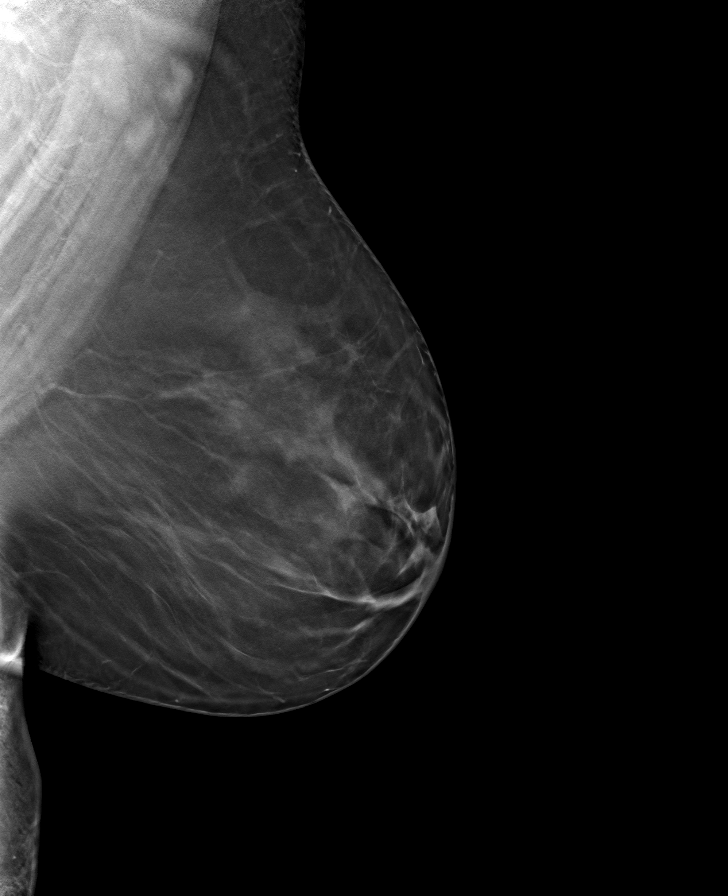

[L CC tomo · tomo slice 45/90.0]
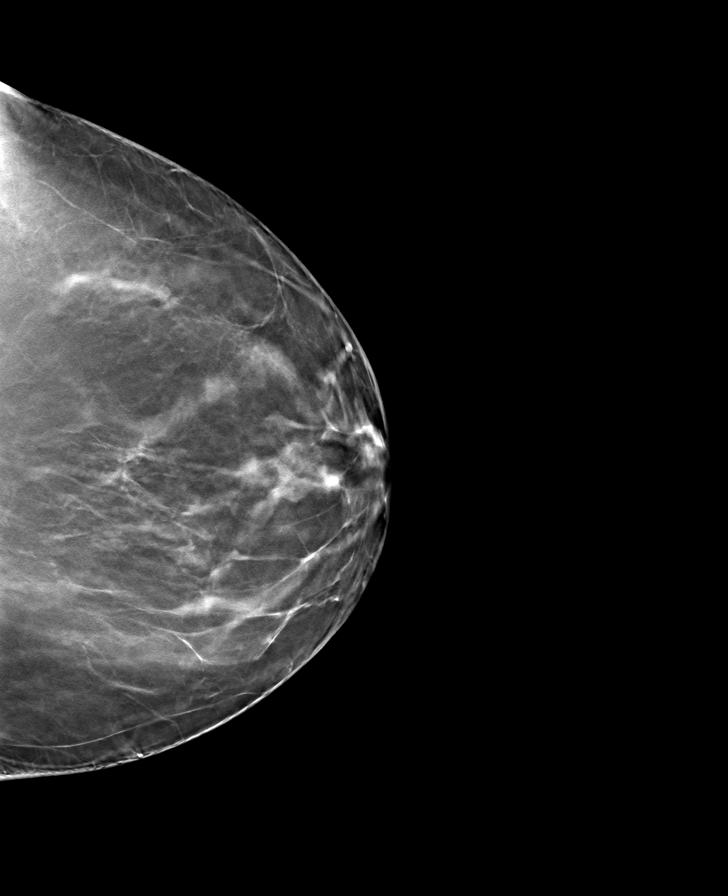

[R CC tomo · tomo slice 42/83.0]
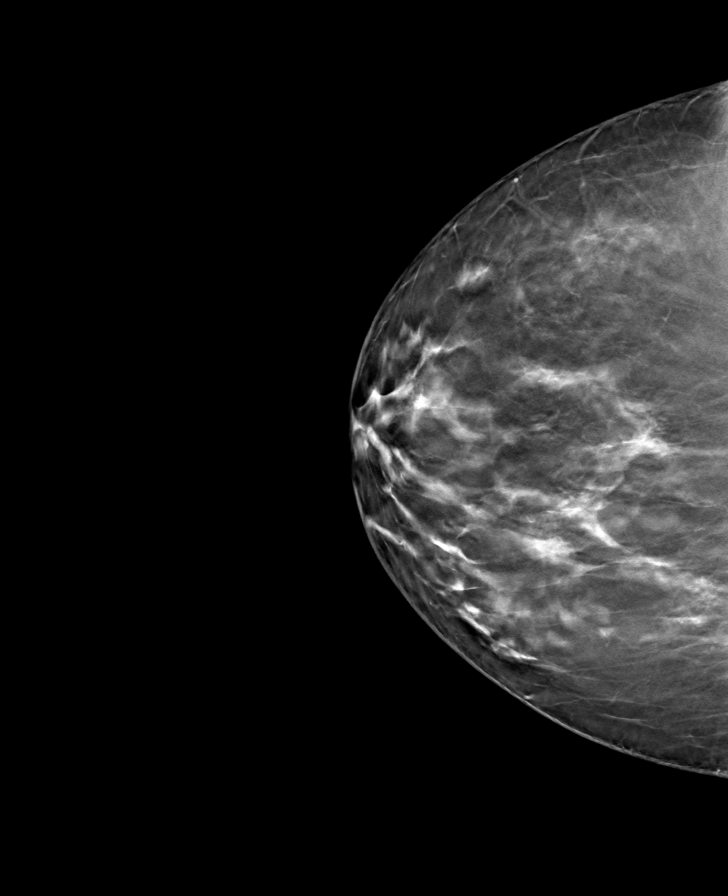

[8 of 24 positions shown; findings below may reference images not displayed]

ACR Breast Density Category c: The breast tissue is heterogeneously
dense, which may obscure small masses.
FINDINGS: There are no findings suspicious for malignancy. Images were
processed with CAD.
IMPRESSION: No mammographic evidence of malignancy. A result letter of this
screening mammogram will be mailed directly to the patient.

RECOMMENDATION:
Screening mammogram in one year. (Code:[id number])

BI-RADS CATEGORY  1: Negative.

## 2020-01-31 ENCOUNTER — Telehealth (INDEPENDENT_AMBULATORY_CARE_PROVIDER_SITE_OTHER): Payer: BC Managed Care – PPO | Admitting: Family Medicine

## 2020-01-31 ENCOUNTER — Encounter: Payer: Self-pay | Admitting: Family Medicine

## 2020-01-31 DIAGNOSIS — J069 Acute upper respiratory infection, unspecified: Secondary | ICD-10-CM | POA: Insufficient documentation

## 2020-01-31 MED ORDER — BENZONATATE 200 MG PO CAPS: 200.0000 mg | ORAL_CAPSULE | Freq: Three times a day (TID) | ORAL | 1 refills | Status: DC | PRN

## 2020-01-31 NOTE — Progress Notes (Signed)
Symptoms x 1  Meds: alka seltzer plus, mucinex DM  Cough, night time congestion  Have not been tested for COVID.

## 2020-01-31 NOTE — Progress Notes (Signed)
Courtney Daniels - 55 y.o. female MRN [id number]  Date of birth: 1965/09/06   This visit type was conducted due to national recommendations for restrictions regarding the COVID-19 Pandemic (e.g. social distancing).  This format is felt to be most appropriate for this patient at this time.  All issues noted in this document were discussed and addressed.  No physical exam was performed (except for noted visual exam findings with Video Visits).  I discussed the limitations of evaluation and management by telemedicine and the availability of in person appointments. The patient expressed understanding and agreed to proceed.  I connected with@ on 01/31/20 at 10:30 AM EST by a video enabled telemedicine application and verified that I am speaking with the correct person using two identifiers.  Interactive audio and video telecommunications were attempted between this provider and patient, however failed, due to patient having technical difficulties OR patient did not have access to video capability.  We continued and completed visit with audio only.    Present at visit: Everrett Coombe, DO Wilfred Lacy   Patient Location: Home [address] [place] [address]   Provider location:   [facility]  Chief Complaint  Patient presents with  . Cough  . Nasal Congestion    HPI  Courtney Daniels is a 55 y.o. female who presents via audio/video conferencing for a telehealth visit today.  She has complaint today of cough and congestion.  Symptoms first started about 8 days ago.  The only symptom she has had are congestion and cough.  Cough was productive initially now more of a dry cough.  Symptoms overall have improved but cough remains bothersome.  She is using OTC cold medications with some improvement.  She has not had shortness of breath, wheezing, chest pain/tightness, body aches, headache, fever or chills with this.     ROS:  A comprehensive ROS was completed and negative except as noted per  HPI  Past Medical History:  Diagnosis Date  . BMI 33.0-33.9,adult 04/28/2018  . Ectopic pregnancy 2005    Past Surgical History:  Procedure Laterality Date  . LAPAROSCOPY Left    salpingectomy  . TUBAL LIGATION  2005   Left only    Family History  Problem Relation Age of Onset  . Cataracts Maternal Grandmother   . Diabetes Maternal Aunt   . Diabetes Other        fathers side  . Cancer Brother 66       oropharyngeal cancer    Social History   Socioeconomic History  . Marital status: Married    Spouse name: Not on file  . Number of children: 1  . Years of education: MA  . Highest education level: Not on file  Occupational History  . Occupation: Doctor, general practice: [employer]  Tobacco Use  . Smoking status: Never Smoker  . Smokeless tobacco: Never Used  Substance and Sexual Activity  . Alcohol use: Yes    Alcohol/week: 2.0 standard drinks    Types: 2 Standard drinks or equivalent per week  . Drug use: No  . Sexual activity: Yes    Partners: Male  Other Topics Concern  . Not on file  Social History Narrative   Divorced from [place].  No exercise.  Sister of Sherrie Mustache.    Social Determinants of Health   Financial Resource Strain: Not on file  Food Insecurity: Not on file  Transportation Needs: Not on file  Physical Activity: Not on  file  Stress: Not on file  Social Connections: Not on file  Intimate Partner Violence: Not on file     Current Outpatient Medications:  .  benzonatate (TESSALON) 200 MG capsule, Take 1 capsule (200 mg total) by mouth 3 (three) times daily as needed for cough., Disp: 30 capsule, Rfl: 1  EXAM:  VITALS per patient if applicable: Wt 200 lb (90.7 kg)   LMP 01/16/2013   BMI 31.32 kg/m   GENERAL: alert, oriented, and in no acute distress  LUNGS: no signs of respiratory distress, breathing rate appears normal, no obvious gross SOB, gasping or wheezing  PSYCH/NEURO: pleasant and  cooperative, no obvious depression or anxiety, speech and thought processing grossly intact  ASSESSMENT AND PLAN:  Discussed the following assessment and plan:  Viral URI I think this is less likely to be COVID based on mild nature of symptoms.  Discussed that cough can linger for a couple of weeks after other symptoms of cold have resolved.   She is instructed to continue with home supportive care with OTC medications as needed and increased fluids.  Will add on tessalon perles.tid as needed.  Instructed to contact clinic if developing new or worsening symptoms.      I discussed the assessment and treatment plan with the patient. The patient was provided an opportunity to ask questions and all were answered. The patient agreed with the plan and demonstrated an understanding of the instructions.   The patient was advised to call back or seek an in-person evaluation if the symptoms worsen or if the condition fails to improve as anticipated.   30 minutes spent including pre visit preparation, review of prior notes and labs, encounter with patient via telephone visit and same day documentation.  Luetta Nutting, DO

## 2020-01-31 NOTE — Assessment & Plan Note (Addendum)
I think this is less likely to be COVID based on mild nature of symptoms.  Discussed that cough can linger for a couple of weeks after other symptoms of cold have resolved.   She is instructed to continue with home supportive care with OTC medications as needed and increased fluids.  Will add on tessalon perles.tid as needed.  Instructed to contact clinic if developing new or worsening symptoms.

## 2020-02-27 ENCOUNTER — Other Ambulatory Visit: Payer: Self-pay | Admitting: Family Medicine

## 2020-02-27 DIAGNOSIS — Z1231 Encounter for screening mammogram for malignant neoplasm of breast: Secondary | ICD-10-CM

## 2020-03-12 ENCOUNTER — Encounter: Payer: BC Managed Care – PPO | Admitting: Family Medicine

## 2020-03-29 ENCOUNTER — Encounter: Payer: Self-pay | Admitting: Family Medicine

## 2020-03-29 ENCOUNTER — Ambulatory Visit: Payer: BC Managed Care – PPO

## 2020-03-29 ENCOUNTER — Ambulatory Visit (INDEPENDENT_AMBULATORY_CARE_PROVIDER_SITE_OTHER): Payer: BC Managed Care – PPO | Admitting: Family Medicine

## 2020-03-29 ENCOUNTER — Telehealth: Payer: Self-pay | Admitting: Family Medicine

## 2020-03-29 ENCOUNTER — Other Ambulatory Visit: Payer: Self-pay

## 2020-03-29 VITALS — BP 128/69 | HR 62 | Temp 98.0°F | Wt 210.0 lb

## 2020-03-29 DIAGNOSIS — Z0000 Encounter for general adult medical examination without abnormal findings: Secondary | ICD-10-CM

## 2020-03-29 NOTE — Progress Notes (Signed)
Subjective:     Courtney Daniels is a 55 y.o. female and is here for a comprehensive physical exam. The patient reports no problems. She is not exercising.  Has been mostly working from home from [place] but is asked excited about getting back into the office and being around people again.  Social History   Socioeconomic History  . Marital status: Married    Spouse name: Not on file  . Number of children: 1  . Years of education: MA  . Highest education level: Not on file  Occupational History  . Occupation: Barista: [employer]  Tobacco Use  . Smoking status: Never Smoker  . Smokeless tobacco: Never Used  Substance and Sexual Activity  . Alcohol use: Yes    Alcohol/week: 2.0 standard drinks    Types: 2 Standard drinks or equivalent per week  . Drug use: No  . Sexual activity: Yes    Partners: Male  Other Topics Concern  . Not on file  Social History Narrative   Divorced from [place].  No exercise.  Sister of Azalee Course.    Social Determinants of Health   Financial Resource Strain: Not on file  Food Insecurity: Not on file  Transportation Needs: Not on file  Physical Activity: Not on file  Stress: Not on file  Social Connections: Not on file  Intimate Partner Violence: Not on file   Health Maintenance  Topic Date Due  . PAP SMEAR-Modifier  02/18/2019  . MAMMOGRAM  03/08/2021  . TETANUS/TDAP  02/08/2024  . COLONOSCOPY (Pts 45-11yrs Insurance coverage will need to be confirmed)  03/03/2025  . INFLUENZA VACCINE  Completed  . COVID-19 Vaccine  Completed  . Hepatitis C Screening  Completed  . HIV Screening  Completed  . HPV VACCINES  Aged Out    The following portions of the patient's history were reviewed and updated as appropriate: allergies, current medications, past family history, past medical history, past social history, past surgical history and problem list.  Review of Systems A comprehensive review of systems  was negative.   Objective:    BP 128/69 (BP Location: Left Arm, Patient Position: Sitting, Cuff Size: Normal)   Pulse 62   Temp 98 F (36.7 C) (Oral)   Wt 210 lb (95.3 kg)   LMP 01/16/2013   SpO2 100%   BMI 32.89 kg/m  General appearance: alert, cooperative and appears stated age Head: Normocephalic, without obvious abnormality, atraumatic Eyes: conj clear, EOMi, PEERLA Ears: normal TM's and external ear canals both ears Nose: Nares normal. Septum midline. Mucosa normal. No drainage or sinus tenderness. Throat: lips, mucosa, and tongue normal; teeth and gums normal Neck: no adenopathy, no carotid bruit, no JVD, supple, symmetrical, trachea midline and thyroid not enlarged, symmetric, no tenderness/mass/nodules Back: symmetric, no curvature. ROM normal. No CVA tenderness. Lungs: clear to auscultation bilaterally Breasts: normal appearance, no masses or tenderness Heart: regular rate and rhythm, S1, S2 normal, no murmur, click, rub or gallop Abdomen: soft, non-tender; bowel sounds normal; no masses,  no organomegaly Extremities: extremities normal, atraumatic, no cyanosis or edema Pulses: 2+ and symmetric Skin: Skin color, texture, turgor normal. No rashes or lesions Lymph nodes: Cervical, supraclavicular, and axillary nodes normal. Neurologic: Alert and oriented X 3, normal strength and tone. Normal symmetric reflexes. Normal coordination and gait    Assessment:    Healthy female exam.      Plan:     See After Visit Summary for  Counseling Recommendations    Keep up a regular exercise program and make sure you are eating a healthy diet Try to eat 4 servings of dairy a day, or if you are lactose intolerant take a calcium with vitamin D daily.  Your vaccines are up to date.   Cherry angiomas on chest wall.

## 2020-03-29 NOTE — Patient Instructions (Signed)
Preventive Care 56-56 Years Old, Female Preventive care refers to lifestyle choices and visits with your health care provider that can promote health and wellness. This includes:  A yearly physical exam. This is also called an annual wellness visit.  Regular dental and eye exams.  Immunizations.  Screening for certain conditions.  Healthy lifestyle choices, such as: ? Eating a healthy diet. ? Getting regular exercise. ? Not using drugs or products that contain nicotine and tobacco. ? Limiting alcohol use. What can I expect for my preventive care visit? Physical exam Your health care provider will check your:  Height and weight. These may be used to calculate your BMI (body mass index). BMI is a measurement that tells if you are at a healthy weight.  Heart rate and blood pressure.  Body temperature.  Skin for abnormal spots. Counseling Your health care provider may ask you questions about your:  Past medical problems.  Family's medical history.  Alcohol, tobacco, and drug use.  Emotional well-being.  Home life and relationship well-being.  Sexual activity.  Diet, exercise, and sleep habits.  Work and work Statistician.  Access to firearms.  Method of birth control.  Menstrual cycle.  Pregnancy history. What immunizations do I need? Vaccines are usually given at various ages, according to a schedule. Your health care provider will recommend vaccines for you based on your age, medical history, and lifestyle or other factors, such as travel or where you work.   What tests do I need? Blood tests  Lipid and cholesterol levels. These may be checked every 5 years, or more often if you are over 56 years old.  Hepatitis C test.  Hepatitis B test. Screening  Lung cancer screening. You may have this screening every year starting at age 56 if you have a 30-pack-year history of smoking and currently smoke or have quit within the past 15 years.  Colorectal cancer  screening. ? All adults should have this screening starting at age 56 and continuing until age 17. ? Your health care provider may recommend screening at age 49 if you are at increased risk. ? You will have tests every 1-10 years, depending on your results and the type of screening test.  Diabetes screening. ? This is done by checking your blood sugar (glucose) after you have not eaten for a while (fasting). ? You may have this done every 1-3 years.  Mammogram. ? This may be done every 1-2 years. ? Talk with your health care provider about when you should start having regular mammograms. This may depend on whether you have a family history of breast cancer.  BRCA-related cancer screening. This may be done if you have a family history of breast, ovarian, tubal, or peritoneal cancers.  Pelvic exam and Pap test. ? This may be done every 3 years starting at age 56. ? Starting at age 11, this may be done every 5 years if you have a Pap test in combination with an HPV test. Other tests  STD (sexually transmitted disease) testing, if you are at risk.  Bone density scan. This is done to screen for osteoporosis. You may have this scan if you are at high risk for osteoporosis. Talk with your health care provider about your test results, treatment options, and if necessary, the need for more tests. Follow these instructions at home: Eating and drinking  Eat a diet that includes fresh fruits and vegetables, whole grains, lean protein, and low-fat dairy products.  Take vitamin and mineral supplements  as recommended by your health care provider.  Do not drink alcohol if: ? Your health care provider tells you not to drink. ? You are pregnant, may be pregnant, or are planning to become pregnant.  If you drink alcohol: ? Limit how much you have to 0-1 drink a day. ? Be aware of how much alcohol is in your drink. In the [place] one drink equals one 12 oz bottle of beer (355 mL), one 5 oz glass of  wine (148 mL), or one 1 oz glass of hard liquor (44 mL).   Lifestyle  Take daily care of your teeth and gums. Brush your teeth every morning and night with fluoride toothpaste. Floss one time each day.  Stay active. Exercise for at least 30 minutes 5 or more days each week.  Do not use any products that contain nicotine or tobacco, such as cigarettes, e-cigarettes, and chewing tobacco. If you need help quitting, ask your health care provider.  Do not use drugs.  If you are sexually active, practice safe sex. Use a condom or other form of protection to prevent STIs (sexually transmitted infections).  If you do not wish to become pregnant, use a form of birth control. If you plan to become pregnant, see your health care provider for a prepregnancy visit.  If told by your health care provider, take low-dose aspirin daily starting at age 50.  Find healthy ways to cope with stress, such as: ? Meditation, yoga, or listening to music. ? Journaling. ? Talking to a trusted person. ? Spending time with friends and family. Safety  Always wear your seat belt while driving or riding in a vehicle.  Do not drive: ? If you have been drinking alcohol. Do not ride with someone who has been drinking. ? When you are tired or distracted. ? While texting.  Wear a helmet and other protective equipment during sports activities.  If you have firearms in your house, make sure you follow all gun safety procedures. What's next?  Visit your health care provider once a year for an annual wellness visit.  Ask your health care provider how often you should have your eyes and teeth checked.  Stay up to date on all vaccines. This information is not intended to replace advice given to you by your health care provider. Make sure you discuss any questions you have with your health care provider. Document Revised: 10/17/2019 Document Reviewed: 09/23/2017 Elsevier Patient Education  2021 Elsevier Inc.  

## 2020-03-29 NOTE — Telephone Encounter (Signed)
Can we call Courtney [facility] path. We did pap on her last year and it looks like it was never resulted. She is coming in for CP today

## 2020-03-29 NOTE — Telephone Encounter (Signed)
Called [place] [facility], they stated they will fax [place] the results.

## 2020-04-01 LAB — CYTOLOGY - PAP: Diagnosis: UNDETERMINED — AB

## 2020-04-04 ENCOUNTER — Encounter: Payer: BC Managed Care – PPO | Admitting: Family Medicine

## 2020-04-04 ENCOUNTER — Ambulatory Visit: Payer: BC Managed Care – PPO

## 2020-04-12 ENCOUNTER — Ambulatory Visit (INDEPENDENT_AMBULATORY_CARE_PROVIDER_SITE_OTHER): Payer: BC Managed Care – PPO

## 2020-04-12 ENCOUNTER — Other Ambulatory Visit: Payer: Self-pay

## 2020-04-12 DIAGNOSIS — Z1231 Encounter for screening mammogram for malignant neoplasm of breast: Secondary | ICD-10-CM | POA: Diagnosis not present

## 2020-04-15 LAB — CBC: WBC: 5.1 10*3/uL (ref 3.8–10.8)

## 2020-04-16 LAB — COMPLETE METABOLIC PANEL WITH GFR
AG Ratio: 1.4 (calc) (ref 1.0–2.5)
ALT: 16 U/L (ref 6–29)
AST: 14 U/L (ref 10–35)
Albumin: 4.2 g/dL (ref 3.6–5.1)
Alkaline phosphatase (APISO): 89 U/L (ref 37–153)
BUN: 17 mg/dL (ref 7–25)
CO2: 27 mmol/L (ref 20–32)
Calcium: 9.1 mg/dL (ref 8.6–10.4)
Chloride: 105 mmol/L (ref 98–110)
Creat: 0.84 mg/dL (ref 0.50–1.05)
GFR, Est African American: 91 mL/min/{1.73_m2} (ref >=60)
GFR, Est Non African American: 78 mL/min/{1.73_m2} (ref >=60)
Globulin: 3 g/dL (calc) (ref 1.9–3.7)
Glucose, Bld: 83 mg/dL (ref 65–99)
Potassium: 4.3 mmol/L (ref 3.5–5.3)
Sodium: 141 mmol/L (ref 135–146)
Total Bilirubin: 0.3 mg/dL (ref 0.2–1.2)
Total Protein: 7.2 g/dL (ref 6.1–8.1)

## 2020-04-16 LAB — CBC
HCT: 39.5 % (ref 35.0–45.0)
Hemoglobin: 13 g/dL (ref 11.7–15.5)
MCH: 26.9 pg — ABNORMAL LOW (ref 27.0–33.0)
MCHC: 32.9 g/dL (ref 32.0–36.0)
MCV: 81.8 fL (ref 80.0–100.0)
MPV: 9 fL (ref 7.5–12.5)
Platelets: 346 10*3/uL (ref 140–400)
RBC: 4.83 10*6/uL (ref 3.80–5.10)
RDW: 12.1 % (ref 11.0–15.0)

## 2020-04-16 LAB — LIPID PANEL
Cholesterol: 135 mg/dL (ref <200)
HDL: 52 mg/dL (ref >=50)
LDL Cholesterol (Calc): 66 mg/dL (calc)
Non-HDL Cholesterol (Calc): 83 mg/dL (calc) (ref <130)
Total CHOL/HDL Ratio: 2.6 (calc) (ref <5.0)
Triglycerides: 88 mg/dL (ref <150)

## 2020-04-16 LAB — HEMOGLOBIN A1C
Hgb A1c MFr Bld: 5.5 % of total Hgb (ref <5.7)
Mean Plasma Glucose: 111 mg/dL
eAG (mmol/L): 6.2 mmol/L

## 2021-04-01 ENCOUNTER — Other Ambulatory Visit: Payer: Self-pay | Admitting: Family Medicine

## 2021-04-01 DIAGNOSIS — Z1231 Encounter for screening mammogram for malignant neoplasm of breast: Secondary | ICD-10-CM

## 2021-04-02 ENCOUNTER — Other Ambulatory Visit: Payer: Self-pay

## 2021-04-02 ENCOUNTER — Ambulatory Visit (INDEPENDENT_AMBULATORY_CARE_PROVIDER_SITE_OTHER): Payer: BC Managed Care – PPO | Admitting: Family Medicine

## 2021-04-02 ENCOUNTER — Encounter: Payer: Self-pay | Admitting: Family Medicine

## 2021-04-02 VITALS — BP 116/79 | HR 82 | Resp 18 | Ht 67.0 in | Wt 213.0 lb

## 2021-04-02 DIAGNOSIS — Z0000 Encounter for general adult medical examination without abnormal findings: Secondary | ICD-10-CM | POA: Diagnosis not present

## 2021-04-02 NOTE — Progress Notes (Signed)
? ?CPE ? ?Subjective:  ?Patient ID: Courtney Daniels, female    DOB: [birth year]  Age: 56 y.o. MRN: [id number] ? ?CC:  ?Chief Complaint  ?Patient presents with  ? Annual Exam  ?  Fasting   ? ? ?HPI ?Courtney Daniels presents for CPE. ? ?Mammogram scheduled for later this month.  Last colonoscopy was in 2017.  Tdap is up-to-date performed in 2016.  She did have COVID in February but says that she is feeling much better.  She did end up taking Paxlovid.  She is not currently exercising. ? ?Past Medical History:  ?Diagnosis Date  ? BMI 33.0-33.9,adult 04/28/2018  ? Ectopic pregnancy 2005  ? ? ?Past Surgical History:  ?Procedure Laterality Date  ? LAPAROSCOPY Left   ? salpingectomy  ? TUBAL LIGATION  2005  ? Left only  ? ? ?Family History  ?Problem Relation Age of Onset  ? Healthy Sister   ? Healthy Sister   ? Cancer Brother 73  ?     oropharyngeal cancer  ? Cataracts Maternal Grandmother   ? Diabetes Maternal Aunt   ? Diabetes Other   ?     fathers side  ? ? ?Social History  ? ?Socioeconomic History  ? Marital status: Divorced  ?  Spouse name: Not on file  ? Number of children: 0  ? Years of education: MA  ? Highest education level: Not on file  ?Occupational History  ? Occupation: Metallurgist  ?  Employer: [employer]. [employer]  ?Tobacco Use  ? Smoking status: Never  ? Smokeless tobacco: Never  ?Substance and Sexual Activity  ? Alcohol use: Not Currently  ?  Alcohol/week: 1.0 standard drink  ?  Types: 1 Glasses of wine per week  ?  Comment: wine occ  ? Drug use: No  ? Sexual activity: Yes  ?  Partners: Male  ?Other Topics Concern  ? Not on file  ?Social History Narrative  ? Divorced from [place].  No exercise.  Sister of Azalee Course. 1 cup of coffee every other day.   ? ?Social Determinants of Health  ? ?Financial Resource Strain: Not on file  ?Food Insecurity: Not on file  ?Transportation Needs: Not on file  ?Physical Activity: Not on file  ?Stress: Not on file  ?Social Connections: Not on file   ?Intimate Partner Violence: Not on file  ? ? ?No outpatient medications prior to visit.  ? ?No facility-administered medications prior to visit.  ? ? ?No Known Allergies ? ?ROS ?Review of Systems ? ?  ?Objective:  ?  ?Physical Exam ?Exam conducted with a chaperone present.  ?Constitutional:   ?   Appearance: Normal appearance. She is well-developed.  ?HENT:  ?   Head: Normocephalic and atraumatic.  ?   Right Ear: Tympanic membrane, ear canal and external ear normal.  ?   Left Ear: Tympanic membrane, ear canal and external ear normal.  ?   Nose: Nose normal.  ?   Mouth/Throat:  ?   Mouth: Mucous membranes are moist.  ?   Pharynx: Oropharynx is clear. No oropharyngeal exudate or posterior oropharyngeal erythema.  ?Eyes:  ?   Conjunctiva/sclera: Conjunctivae normal.  ?   Pupils: Pupils are equal, round, and reactive to light.  ?Neck:  ?   Thyroid: No thyromegaly.  ?Cardiovascular:  ?   Rate and Rhythm: Normal rate and regular rhythm.  ?   Heart sounds: Normal heart sounds.  ?Pulmonary:  ?   Effort: Pulmonary effort  is normal.  ?   Breath sounds: Normal breath sounds. No wheezing.  ?Chest:  ?Breasts: ?   Right: Normal.  ?   Left: Normal.  ?Abdominal:  ?   General: Bowel sounds are normal.  ?   Palpations: Abdomen is soft.  ?Musculoskeletal:  ?   Cervical back: Neck supple.  ?Lymphadenopathy:  ?   Cervical: No cervical adenopathy.  ?   Upper Body:  ?   Right upper body: No supraclavicular or axillary adenopathy.  ?   Left upper body: No supraclavicular or axillary adenopathy.  ?Skin: ?   General: Skin is warm and dry.  ?Neurological:  ?   Mental Status: She is alert and oriented to person, place, and time.  ?Psychiatric:     ?   Behavior: Behavior normal.  ? ? ?BP 116/79   Pulse 82   Resp 18   Ht '5\' 7"'$  (1.702 m)   Wt 213 lb (96.6 kg)   LMP 01/16/2013   SpO2 97%   BMI 33.36 kg/m?  ?Wt Readings from Last 3 Encounters:  ?04/02/21 213 lb (96.6 kg)  ?03/29/20 210 lb (95.3 kg)  ?01/31/20 200 lb (90.7 kg)  ? ? ? ?Health  Maintenance Due  ?Topic Date Due  ? COVID-19 Vaccine (3 - Booster for Janssen series) 04/23/2020  ? ? ?There are no preventive care reminders to display for this patient. ? ?Lab Results  ?Component Value Date  ? TSH 1.439 02/11/2015  ? ?Lab Results  ?Component Value Date  ? WBC 5.1 04/15/2020  ? HGB 13.0 04/15/2020  ? HCT 39.5 04/15/2020  ? MCV 81.8 04/15/2020  ? PLT 346 04/15/2020  ? ?Lab Results  ?Component Value Date  ? NA 141 04/15/2020  ? K 4.3 04/15/2020  ? CO2 27 04/15/2020  ? GLUCOSE 83 04/15/2020  ? BUN 17 04/15/2020  ? CREATININE 0.84 04/15/2020  ? BILITOT 0.3 04/15/2020  ? ALKPHOS 107 02/18/2016  ? AST 14 04/15/2020  ? ALT 16 04/15/2020  ? PROT 7.2 04/15/2020  ? ALBUMIN 4.3 02/18/2016  ? CALCIUM 9.1 04/15/2020  ? ?Lab Results  ?Component Value Date  ? CHOL 135 04/15/2020  ? ?Lab Results  ?Component Value Date  ? HDL 52 04/15/2020  ? ?Lab Results  ?Component Value Date  ? [place] 66 04/15/2020  ? ?Lab Results  ?Component Value Date  ? TRIG 88 04/15/2020  ? ?Lab Results  ?Component Value Date  ? CHOLHDL 2.6 04/15/2020  ? ?Lab Results  ?Component Value Date  ? HGBA1C 5.5 04/15/2020  ? ? ?  ?Assessment & Plan:  ? ?Problem List Items Addressed This Visit   ?None ?Visit Diagnoses   ? ? Wellness examination    -  Primary  ? Relevant Orders  ? Lipid Panel w/reflex Direct LDL  ? COMPLETE METABOLIC PANEL WITH GFR  ? CBC  ? ?  ? ? ?Keep up a regular exercise program and make sure you are eating a healthy diet ?Try to eat 4 servings of dairy a day, or if you are lactose intolerant take a calcium with vitamin D daily.  ?Your vaccines are up to date.  ? ? ?No orders of the defined types were placed in this encounter. ? ? ?Follow-up: Return in about 1 year (around 04/03/2022) for Wellness Exam.  ? ? ?Beatrice Lecher, MD ?

## 2021-04-03 ENCOUNTER — Other Ambulatory Visit: Payer: Self-pay | Admitting: *Deleted

## 2021-04-03 LAB — LIPID PANEL W/REFLEX DIRECT LDL
Cholesterol: 144 mg/dL (ref <200)
HDL: 61 mg/dL (ref >=50)
LDL Cholesterol (Calc): 65 mg/dL (calc)
Non-HDL Cholesterol (Calc): 83 mg/dL (calc) (ref <130)
Total CHOL/HDL Ratio: 2.4 (calc) (ref <5.0)
Triglycerides: 99 mg/dL (ref <150)

## 2021-04-03 LAB — COMPLETE METABOLIC PANEL WITH GFR
AG Ratio: 1.2 (calc) (ref 1.0–2.5)
ALT: 21 U/L (ref 6–29)
AST: 16 U/L (ref 10–35)
Albumin: 3.9 g/dL (ref 3.6–5.1)
Alkaline phosphatase (APISO): 95 U/L (ref 37–153)
BUN: 12 mg/dL (ref 7–25)
CO2: 27 mmol/L (ref 20–32)
Calcium: 9.3 mg/dL (ref 8.6–10.4)
Chloride: 104 mmol/L (ref 98–110)
Creat: 0.83 mg/dL (ref 0.50–1.03)
Globulin: 3.3 g/dL (calc) (ref 1.9–3.7)
Glucose, Bld: 91 mg/dL (ref 65–99)
Potassium: 4.9 mmol/L (ref 3.5–5.3)
Sodium: 140 mmol/L (ref 135–146)
Total Bilirubin: 0.3 mg/dL (ref 0.2–1.2)
Total Protein: 7.2 g/dL (ref 6.1–8.1)
eGFR: 83 mL/min/{1.73_m2} (ref >=60)

## 2021-04-03 LAB — CBC
HCT: 42 % (ref 35.0–45.0)
Hemoglobin: 13.5 g/dL (ref 11.7–15.5)
MCH: 26.7 pg — ABNORMAL LOW (ref 27.0–33.0)
MCHC: 32.1 g/dL (ref 32.0–36.0)
MCV: 83.2 fL (ref 80.0–100.0)
MPV: 9 fL (ref 7.5–12.5)
Platelets: 332 10*3/uL (ref 140–400)
RBC: 5.05 10*6/uL (ref 3.80–5.10)
RDW: 12.4 % (ref 11.0–15.0)
WBC: 5.1 10*3/uL (ref 3.8–10.8)

## 2021-04-03 NOTE — Progress Notes (Signed)
Your lab work is within acceptable range and there are no concerning findings.   ?

## 2021-04-17 ENCOUNTER — Other Ambulatory Visit: Payer: Self-pay

## 2021-04-17 ENCOUNTER — Ambulatory Visit (INDEPENDENT_AMBULATORY_CARE_PROVIDER_SITE_OTHER): Payer: BC Managed Care – PPO

## 2021-04-17 DIAGNOSIS — Z1231 Encounter for screening mammogram for malignant neoplasm of breast: Secondary | ICD-10-CM

## 2021-04-17 NOTE — Progress Notes (Signed)
Please call patient. Normal mammogram.  Repeat in 1 year.  

## 2022-03-25 ENCOUNTER — Other Ambulatory Visit: Payer: Self-pay | Admitting: Family Medicine

## 2022-03-25 DIAGNOSIS — Z1231 Encounter for screening mammogram for malignant neoplasm of breast: Secondary | ICD-10-CM

## 2022-04-20 ENCOUNTER — Encounter: Payer: Self-pay | Admitting: Family Medicine

## 2022-04-20 ENCOUNTER — Other Ambulatory Visit (HOSPITAL_COMMUNITY)
Admission: RE | Admit: 2022-04-20 | Discharge: 2022-04-20 | Disposition: A | Discharge status: Home / Self Care | Payer: BC Managed Care – PPO | Source: Ambulatory Visit | Attending: Family Medicine | Admitting: Family Medicine

## 2022-04-20 ENCOUNTER — Ambulatory Visit (INDEPENDENT_AMBULATORY_CARE_PROVIDER_SITE_OTHER): Payer: BC Managed Care – PPO | Admitting: Family Medicine

## 2022-04-20 VITALS — BP 127/69 | HR 82 | Ht 67.0 in | Wt 213.0 lb

## 2022-04-20 DIAGNOSIS — Z124 Encounter for screening for malignant neoplasm of cervix: Secondary | ICD-10-CM

## 2022-04-20 DIAGNOSIS — Z0000 Encounter for general adult medical examination without abnormal findings: Secondary | ICD-10-CM

## 2022-04-20 DIAGNOSIS — R87612 Low grade squamous intraepithelial lesion on cytologic smear of cervix (LGSIL): Secondary | ICD-10-CM

## 2022-04-20 NOTE — Progress Notes (Signed)
Complete physical exam  Patient: Courtney Daniels   DOB: 02/13/1965   57 y.o. Female  MRN: [id number]  Subjective:    Chief Complaint  Patient presents with   Annual Exam    Deven Offenbacker is a 57 y.o. female who presents today for a complete physical exam. She reports consuming a general diet.  Walking 2 days per week for 30 min   She generally feels well. She reports sleeping fair, frequent awkenings at night but still get total of 7 hours most night. . She does not have additional problems to discuss today.    Most recent fall risk assessment:    04/20/2022    1:52 PM  [place] in the past year? 0  Number falls in past yr: 0  Injury with Fall? 0  Risk for fall due to : No Fall Risks  Follow up Falls evaluation completed     Most recent depression screenings:    04/20/2022    1:52 PM 04/02/2021    9:08 AM  PHQ 2/9 Scores  PHQ - 2 Score 0 0        Patient Care Team: Hali Marry, MD as PCP - General (Family Medicine)   No outpatient medications prior to visit.   No facility-administered medications prior to visit.    ROS        Objective:     BP 127/69   Pulse 82   Ht 5\' 7"  (1.702 m)   Wt 213 lb (96.6 kg)   LMP 01/16/2013   SpO2 95%   BMI 33.36 kg/m    Physical Exam Vitals and nursing note reviewed. Exam conducted with a chaperone present.  Constitutional:      Appearance: She is well-developed.  HENT:     Head: Normocephalic and atraumatic.     Right Ear: External ear normal.     Left Ear: External ear normal.     Nose: Nose normal.  Eyes:     Conjunctiva/sclera: Conjunctivae normal.     Pupils: Pupils are equal, round, and reactive to light.  Neck:     Thyroid: No thyromegaly.  Cardiovascular:     Rate and Rhythm: Normal rate and regular rhythm.     Heart sounds: Normal heart sounds.  Pulmonary:     Effort: Pulmonary effort is normal.     Breath sounds: Normal breath sounds. No wheezing.  Genitourinary:    General:  Normal vulva.     Labia:        Right: No rash.        Left: No rash.      Vagina: Normal.     Cervix: Normal.     Uterus: Normal.      Adnexa: Right adnexa normal and left adnexa normal.  Musculoskeletal:     Cervical back: Neck supple.  Lymphadenopathy:     Cervical: No cervical adenopathy.  Skin:    General: Skin is warm and dry.  Neurological:     Mental Status: She is alert and oriented to person, place, and time.  Psychiatric:        Behavior: Behavior normal.      No results found for any visits on 04/20/22.     Assessment & Plan:    Routine Health Maintenance and Physical Exam  Immunization History  Administered Date(s) Administered   Influenza Whole 01/08/2009, 01/08/2010   Influenza, Seasonal, Injecte, Preservative Fre 01/15/2012   Influenza,inj,Quad PF,6+ Mos 10/28/2012, 02/07/2014, 02/11/2015, 11/10/2019  Janssen (J&J) SARS-COV-2 Vaccination 05/03/2019   Moderna Sars-Covid-2 Vaccination 02/27/2020   Tdap 02/07/2014    Health Maintenance  Topic Date Due   PAP SMEAR-Modifier  03/08/2022   INFLUENZA VACCINE  04/26/2022 (Originally 08/26/2021)   COVID-19 Vaccine (3 - 2023-24 season) 05/06/2023 (Originally 09/26/2021)   Zoster Vaccines- Shingrix (1 of 2) 07/21/2023 (Originally 07/25/2014)   MAMMOGRAM  04/18/2023   DTaP/Tdap/Td (2 - Td or Tdap) 02/08/2024   COLONOSCOPY (Pts 45-32yrs Insurance coverage will need to be confirmed)  03/03/2025   Hepatitis C Screening  Completed   HIV Screening  Completed   Pneumococcal Vaccine 37-50 Years old  Aged Out   HPV VACCINES  Aged Out    Discussed health benefits of physical activity, and encouraged her to engage in regular exercise appropriate for her age and condition.  Problem List Items Addressed This Visit   None Visit Diagnoses     Wellness examination    -  Primary   Relevant Orders   CBC   COMPLETE METABOLIC PANEL WITH GFR   Lipid Panel w/reflex Direct LDL   Cytology - PAP   TSH   Screening for cervical  cancer       Relevant Orders   Cytology - PAP     Keep up a regular exercise program and make sure you are eating a healthy diet Try to eat 4 servings of dairy a day, or if you are lactose intolerant take a calcium with vitamin D daily.  Your vaccines are up to date.   Return in about 1 year (around 04/20/2023) for Wellness Exam.     Beatrice Lecher, MD

## 2022-04-21 LAB — LIPID PANEL W/REFLEX DIRECT LDL
Cholesterol: 158 mg/dL (ref <200)
HDL: 69 mg/dL (ref >=50)
LDL Cholesterol (Calc): 69 mg/dL (calc)
Non-HDL Cholesterol (Calc): 89 mg/dL (calc) (ref <130)
Total CHOL/HDL Ratio: 2.3 (calc) (ref <5.0)
Triglycerides: 113 mg/dL (ref <150)

## 2022-04-21 LAB — COMPLETE METABOLIC PANEL WITH GFR
AG Ratio: 1.3 (calc) (ref 1.0–2.5)
ALT: 21 U/L (ref 6–29)
AST: 17 U/L (ref 10–35)
Albumin: 4.4 g/dL (ref 3.6–5.1)
Alkaline phosphatase (APISO): 93 U/L (ref 37–153)
BUN: 14 mg/dL (ref 7–25)
CO2: 26 mmol/L (ref 20–32)
Calcium: 9.6 mg/dL (ref 8.6–10.4)
Chloride: 102 mmol/L (ref 98–110)
Creat: 0.76 mg/dL (ref 0.50–1.03)
Globulin: 3.4 g/dL (calc) (ref 1.9–3.7)
Glucose, Bld: 93 mg/dL (ref 65–99)
Potassium: 4.4 mmol/L (ref 3.5–5.3)
Sodium: 139 mmol/L (ref 135–146)
Total Bilirubin: 0.3 mg/dL (ref 0.2–1.2)
Total Protein: 7.8 g/dL (ref 6.1–8.1)
eGFR: 91 mL/min/{1.73_m2} (ref >=60)

## 2022-04-21 LAB — CBC
HCT: 42.1 % (ref 35.0–45.0)
Hemoglobin: 13.6 g/dL (ref 11.7–15.5)
MCH: 26.7 pg — ABNORMAL LOW (ref 27.0–33.0)
MCHC: 32.3 g/dL (ref 32.0–36.0)
MCV: 82.7 fL (ref 80.0–100.0)
MPV: 8.9 fL (ref 7.5–12.5)
Platelets: 372 10*3/uL (ref 140–400)
RBC: 5.09 10*6/uL (ref 3.80–5.10)
RDW: 12.2 % (ref 11.0–15.0)
WBC: 4.9 10*3/uL (ref 3.8–10.8)

## 2022-04-21 LAB — TSH: TSH: 1.95 mIU/L (ref 0.40–4.50)

## 2022-04-21 NOTE — Progress Notes (Signed)
Hi Amil, your blood count is normal.  Thyroid level looks good.  Cholesterol and metabolic panel look good as well.

## 2022-04-22 LAB — CYTOLOGY - PAP
Comment: NEGATIVE
High risk HPV: POSITIVE — AB

## 2022-04-22 NOTE — Progress Notes (Signed)
Courtney Daniels, I do not know if you remember but your Pap smear couple years ago showed a few questionable cells.  This time it is showing some low-grade changes.  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  They will typically send [place] an additional report with specific HPV's.  Either way though because it is showing some low-grade changes I do need to get you over to a GYN to take a further look with a microscope called colposcopy.  We do have a GYN here in our building if that is okay.  Or if you have a preference for different provider or location then please let us know that as well.

## 2022-04-23 ENCOUNTER — Other Ambulatory Visit: Payer: Self-pay | Admitting: *Deleted

## 2022-04-23 NOTE — Addendum Note (Signed)
Addended by: Beatrice Lecher D on: 04/23/2022 11:58 AM   Modules accepted: Orders

## 2022-04-23 NOTE — Addendum Note (Signed)
Addended by: Rae Lips on: 04/23/2022 10:34 AM   Modules accepted: Orders

## 2022-04-23 NOTE — Progress Notes (Signed)
Orders Placed This Encounter     CBC     COMPLETE METABOLIC PANEL WITH GFR     Lipid Panel w/reflex Direct LDL     TSH     Ambulatory referral to Obstetrics / Gynecology         Referral Priority:Routine         Referral Type:Consultation         Referral Reason:Specialty Services Required         Requested Specialty:Obstetrics and Gynecology         Number of Visits Requested:1

## 2022-05-13 ENCOUNTER — Ambulatory Visit (INDEPENDENT_AMBULATORY_CARE_PROVIDER_SITE_OTHER): Payer: BC Managed Care – PPO

## 2022-05-13 DIAGNOSIS — Z1231 Encounter for screening mammogram for malignant neoplasm of breast: Secondary | ICD-10-CM | POA: Diagnosis not present

## 2022-05-15 NOTE — Progress Notes (Signed)
Please call patient. Normal mammogram.  Repeat in 1 year.  

## 2022-05-18 ENCOUNTER — Encounter: Payer: Self-pay | Admitting: Obstetrics & Gynecology

## 2022-05-18 ENCOUNTER — Ambulatory Visit: Payer: BC Managed Care – PPO | Admitting: Obstetrics & Gynecology

## 2022-05-18 ENCOUNTER — Other Ambulatory Visit (HOSPITAL_COMMUNITY)
Admission: RE | Admit: 2022-05-18 | Discharge: 2022-05-18 | Disposition: A | Discharge status: Home / Self Care | Payer: BC Managed Care – PPO | Source: Ambulatory Visit | Attending: Obstetrics & Gynecology | Admitting: Obstetrics & Gynecology

## 2022-05-18 VITALS — BP 127/78 | HR 81 | Ht 67.0 in | Wt 217.0 lb

## 2022-05-18 DIAGNOSIS — R87612 Low grade squamous intraepithelial lesion on cytologic smear of cervix (LGSIL): Secondary | ICD-10-CM

## 2022-05-18 NOTE — Progress Notes (Signed)
   Subjective:    Patient ID: Courtney Daniels, female    DOB: 1965/08/03, 57 y.o.   MRN: [id number]  HPI  57 year old female presents for colposcopy.  She was last seen in our clinic in 2018 when we did colposcopy for L SIL and HPV positive.  Colposcopy note has outline of all of her Pap smear results.  Review of Systems  Constitutional: Negative.   Respiratory: Negative.    Cardiovascular: Negative.   Gastrointestinal: Negative.   Genitourinary: Negative.        Objective:   Physical Exam Vitals reviewed.  Constitutional:      General: She is not in acute distress.    Appearance: She is well-developed.  HENT:     Head: Normocephalic and atraumatic.  Eyes:     Conjunctiva/sclera: Conjunctivae normal.  Cardiovascular:     Rate and Rhythm: Normal rate.  Pulmonary:     Effort: Pulmonary effort is normal.  Genitourinary:    General: Normal vulva.  Skin:    General: Skin is warm and dry.  Neurological:     Mental Status: She is alert and oriented to person, place, and time.  Psychiatric:        Mood and Affect: Mood normal.    Vitals:   05/18/22 1442  BP: 127/78  Pulse: 81  Weight: 98.4 kg  Height:  (1.702 m)       Assessment & Plan:  57 year old female sent to [place] for colposcopy for abnormal Pap smears.  See colposcopy note for further detail.  Patient not on any blood thinners or contraindication to colposcopy today.

## 2022-05-18 NOTE — Progress Notes (Signed)
Colposcopy Procedure Note  Indications: Pap smear 1 months ago showed: low-grade squamous intraepithelial neoplasia (LGSIL - encompassing HPV,mild dysplasia,CIN I). The prior pap showed atypical squamous cellularity of undetermined significance (ASCUS).  Prior cervical/vaginal disease: 2018 LSIP with a few cells suspicious for higher grade lesion and HPV positive--coplo that was satsifactrory with no cervical Bx taken and ECC negative.   Procedure Details  The risks and benefits of the procedure and Written informed consent obtained.  Speculum placed in vagina and excellent visualization of cervix achieved, cervix swabbed x 3 with acetic acid solution.  Findings: Cervix: acetowhite lesion(s) noted at 12-9 o'clock with 3 o'clock extends wide; Lugols and acetic acid used Vaginal inspection: vaginal colposcopy not performed. Vulvar colposcopy: vulvar colposcopy not performed.  Specimens: biopsy at 12, 3, and 7 o'clock; ECC  Complications: bleeding.Required 20 fox swabs and monsels; held pressure for 10 mins with stopped bleeding form the 7 0'clock bx site.  No bleeding noted befroe speculum removed.   Plan: Specimens labelled and sent to Pathology. Will base further treatment on Pathology findings.

## 2022-05-20 LAB — SURGICAL PATHOLOGY

## 2022-07-19 ENCOUNTER — Encounter: Payer: Self-pay | Admitting: Obstetrics & Gynecology

## 2022-08-10 ENCOUNTER — Other Ambulatory Visit: Payer: Self-pay | Admitting: Obstetrics & Gynecology

## 2022-08-10 DIAGNOSIS — R58 Hemorrhage, not elsewhere classified: Secondary | ICD-10-CM

## 2022-08-10 NOTE — Progress Notes (Signed)
Left message on Courtney Daniels's voicemail.  The fissure [facility] LEEP tools are back in stock and we need to schedule her LEEP procedure.  Patient experienc marked bleeding after a small biopsy.  I referred her to hematology.  We will schedule her in an outpatient surgical facility for the LEEP.

## 2022-09-16 ENCOUNTER — Inpatient Hospital Stay: Payer: BC Managed Care – PPO

## 2022-09-16 ENCOUNTER — Inpatient Hospital Stay: Payer: BC Managed Care – PPO | Attending: Hematology and Oncology | Admitting: Hematology and Oncology

## 2022-09-16 VITALS — BP 160/97 | HR 86 | Temp 98.1°F | Resp 15 | Wt 213.8 lb

## 2022-09-16 DIAGNOSIS — Z808 Family history of malignant neoplasm of other organs or systems: Secondary | ICD-10-CM | POA: Insufficient documentation

## 2022-09-16 DIAGNOSIS — D699 Hemorrhagic condition, unspecified: Secondary | ICD-10-CM

## 2022-09-16 DIAGNOSIS — N938 Other specified abnormal uterine and vaginal bleeding: Secondary | ICD-10-CM | POA: Diagnosis present

## 2022-09-16 LAB — CMP (CANCER CENTER ONLY)
ALT: 24 U/L (ref 0–44)
AST: 21 U/L (ref 15–41)
Albumin: 4.1 g/dL (ref 3.5–5.0)
Alkaline Phosphatase: 103 U/L (ref 38–126)
Anion gap: 5 (ref 5–15)
BUN: 10 mg/dL (ref 6–20)
CO2: 29 mmol/L (ref 22–32)
Calcium: 9.2 mg/dL (ref 8.9–10.3)
Chloride: 105 mmol/L (ref 98–111)
Creatinine: 0.76 mg/dL (ref 0.44–1.00)
GFR, Estimated: >60 mL/min (ref >60)
Glucose, Bld: 99 mg/dL (ref 70–99)
Potassium: 4.6 mmol/L (ref 3.5–5.1)
Sodium: 139 mmol/L (ref 135–145)
Total Bilirubin: 0.3 mg/dL (ref 0.3–1.2)
Total Protein: 7.6 g/dL (ref 6.5–8.1)

## 2022-09-16 LAB — APTT: aPTT: 24 seconds (ref 24–36)

## 2022-09-16 LAB — PROTIME-INR
INR: 1.1 (ref 0.8–1.2)
Prothrombin Time: 14 seconds (ref 11.4–15.2)

## 2022-09-16 LAB — CBC WITH DIFFERENTIAL (CANCER CENTER ONLY)
Abs Immature Granulocytes: 0.01 10*3/uL (ref 0.00–0.07)
Basophils Absolute: 0.1 10*3/uL (ref 0.0–0.1)
Basophils Relative: 1 %
Eosinophils Absolute: 0.1 10*3/uL (ref 0.0–0.5)
Eosinophils Relative: 2 %
HCT: 42.6 % (ref 36.0–46.0)
Hemoglobin: 13.3 g/dL (ref 12.0–15.0)
Immature Granulocytes: 0 %
Lymphocytes Relative: 48 %
Lymphs Abs: 2.1 10*3/uL (ref 0.7–4.0)
MCH: 27.1 pg (ref 26.0–34.0)
MCHC: 31.2 g/dL (ref 30.0–36.0)
MCV: 86.8 fL (ref 80.0–100.0)
Monocytes Absolute: 0.3 10*3/uL (ref 0.1–1.0)
Monocytes Relative: 7 %
Neutro Abs: 1.9 10*3/uL (ref 1.7–7.7)
Neutrophils Relative %: 42 %
Platelet Count: 312 10*3/uL (ref 150–400)
RBC: 4.91 MIL/uL (ref 3.87–5.11)
RDW: 14.1 % (ref 11.5–15.5)
WBC Count: 4.5 10*3/uL (ref 4.0–10.5)
nRBC: 0 % (ref 0.0–0.2)

## 2022-09-16 LAB — FIBRINOGEN: Fibrinogen: 337 mg/dL (ref 210–475)

## 2022-09-16 NOTE — Progress Notes (Signed)
[facility] [phone]   [phone]  INITIAL CONSULT NOTE  Patient Care Team: Agapito Games, MD as PCP - General (Family Medicine)  Hematological/Oncological History # Excessive Bleeding 05/18/2022: Patient underwent cervical biopsy with excessive bleeding noted. 09/16/2022: establish care with Dr. Leonides Schanz   CHIEF COMPLAINTS/PURPOSE OF CONSULTATION:  "Excessive Bleeding "  HISTORY OF PRESENTING ILLNESS:  Courtney Daniels 57 y.o. female with medical history significant for obesity who presents for evaluation of excessive bleeding after an OB/GYN procedure.  On review of the previous records Courtney Daniels underwent a cervical biopsy on 05/18/2022.  There was reported during the procedure the patient experienced "marked bleeding" after the small biopsy.  Due to concern for this excessive bleeding she was referred to hematology for further evaluation and management.  Of note the OB/GYN provider would like to perform a LEEP procedure but wants to have clearance from a hematological perspective before pursuing it.  On exam today Courtney Daniels reports that she underwent a colposcopy in April 2024.  She reports that it was reported to her that she had excessive bleeding.  She reports that it took about 10 minutes to control.  She has an upcoming scheduled LEEP procedure and there is concerned that she may have excessive bleeding with that procedure as well.  On discussion today she notes that she does not have any history of abnormal bleeding.  She does not have any nosebleeds, gum bleeding, or dark stools.  She is never undergone any major surgeries or dental extractions.  She reports that she has never had any excessive bruising or trouble with controlling bleeding after small nicks or cuts.  She reports that she has always had heavy menstrual cycles and they started when she was quite young.  She has been on birth control since she was 42 or 57 years old.  She reports  that she was pregnant 1997 and unfortunately 2005 had an ectopic pregnancy.  She did have a root canal but no dental extractions.  She does have some peridental disease and requires frequent deep cleanings.  She still has her wisdom teeth.  On further discussion she reports that her mother passed away in a car accident and her father from a gunshot.  She has no children.  She had a brother who had head and neck cancer in 2015.  She is a never smoker but does drink approximately 2 to 3 glasses of wine on the weekends.  She otherwise denies any fevers, chills, sweats, nausea, Mi or diarrhea.  A full 10 point ROS is otherwise negative.  MEDICAL HISTORY:  Past Medical History:  Diagnosis Date   BMI 33.0-33.9,adult 04/28/2018   Ectopic pregnancy 2005    SURGICAL HISTORY: Past Surgical History:  Procedure Laterality Date   LAPAROSCOPY Left    salpingectomy   TUBAL LIGATION  2005   Left only    SOCIAL HISTORY: Social History   Socioeconomic History   Marital status: Divorced    Spouse name: Not on file   Number of children: 0   Years of education: MA   Highest education level: Not on file  Occupational History   Occupation: Doctor, general practice: [employer]  Tobacco Use   Smoking status: Never   Smokeless tobacco: Never  Vaping Use   Vaping status: Never Used  Substance and Sexual Activity   Alcohol use: Not Currently    Alcohol/week: 1.0 standard drink of alcohol  Types: 1 Glasses of wine per week    Comment: wine occ   Drug use: No   Sexual activity: Yes    Partners: Male  Other Topics Concern   Not on file  Social History Narrative   Divorced from [place].  No exercise.  Sister of Courtney Daniels. 1 cup of coffee every other day.    Social Determinants of Health   Financial Resource Strain: Not on file  Food Insecurity: Not on file  Transportation Needs: Not on file  Physical Activity: Not on file  Stress: Not on file  Social  Connections: Not on file  Intimate Partner Violence: Not on file    FAMILY HISTORY: Family History  Problem Relation Age of Onset   Healthy Sister    Healthy Sister    Cancer Brother 51       oropharyngeal cancer   Cataracts Maternal Grandmother    Diabetes Maternal Aunt    Diabetes Other        fathers side    ALLERGIES:  has No Known Allergies.  MEDICATIONS:  No current outpatient medications on file.   No current facility-administered medications for this visit.    REVIEW OF SYSTEMS:   Constitutional: ( - ) fevers, ( - )  chills , ( - ) night sweats Eyes: ( - ) blurriness of vision, ( - ) double vision, ( - ) watery eyes Ears, nose, mouth, throat, and face: ( - ) mucositis, ( - ) sore throat Respiratory: ( - ) cough, ( - ) dyspnea, ( - ) wheezes Cardiovascular: ( - ) palpitation, ( - ) chest discomfort, ( - ) lower extremity swelling Gastrointestinal:  ( - ) nausea, ( - ) heartburn, ( - ) change in bowel habits Skin: ( - ) abnormal skin rashes Lymphatics: ( - ) new lymphadenopathy, ( - ) easy bruising Neurological: ( - ) numbness, ( - ) tingling, ( - ) new weaknesses Behavioral/Psych: ( - ) mood change, ( - ) new changes  All other systems were reviewed with the patient and are negative.  PHYSICAL EXAMINATION:  Vitals:   09/16/22 1406  BP: (!) 160/97  Pulse: 86  Resp: 15  Temp: 98.1 F (36.7 C)  SpO2: 99%   Filed Weights   09/16/22 1406  Weight: 213 lb 12.8 oz (97 kg)    GENERAL: well appearing middle-aged female in NAD  SKIN: skin color, texture, turgor are normal, no rashes or significant lesions EYES: conjunctiva are pink and non-injected, sclera clear LUNGS: clear to auscultation and percussion with normal breathing effort HEART: regular rate & rhythm and no murmurs and no lower extremity edema Musculoskeletal: no cyanosis of digits and no clubbing  PSYCH: alert & oriented x 3, fluent speech NEURO: no focal motor/sensory deficits  LABORATORY DATA:   I have reviewed the data as listed    Latest Ref Rng & Units 09/16/2022    2:42 PM 04/20/2022    2:19 PM 04/02/2021   12:00 AM  CBC  WBC 4.0 - 10.5 K/uL 4.5  4.9  5.1   Hemoglobin 12.0 - 15.0 g/dL 16.1  09.6  04.5   Hematocrit 36.0 - 46.0 % 42.6  42.1  42.0   Platelets 150 - 400 K/uL 312  372  332        Latest Ref Rng & Units 09/16/2022    2:42 PM 04/20/2022    2:19 PM 04/02/2021   12:00 AM  CMP  Glucose 70 - 99  mg/dL 99  93  91   BUN 6 - 20 mg/dL 10  14  12    Creatinine 0.44 - 1.00 mg/dL 7.82  9.56  2.13   Sodium 135 - 145 mmol/L 139  139  140   Potassium 3.5 - 5.1 mmol/L 4.6  4.4  4.9   Chloride 98 - 111 mmol/L 105  102  104   CO2 22 - 32 mmol/L 29  26  27    Calcium 8.9 - 10.3 mg/dL 9.2  9.6  9.3   Total Protein 6.5 - 8.1 g/dL 7.6  7.8  7.2   Total Bilirubin 0.3 - 1.2 mg/dL 0.3  0.3  0.3   Alkaline Phos 38 - 126 U/L 103     AST 15 - 41 U/L 21  17  16    ALT 0 - 44 U/L 24  21  21       ASSESSMENT & PLAN Courtney Daniels 57 y.o. female with medical history significant for obesity who presents for evaluation of excessive bleeding after an OB/GYN procedure.  After review of the labs, review of the records, and discussion with the patient the patients findings are most consistent with heavy bleeding after a biopsy of unclear etiology.  # Excessive Bleeding s/p OB/GYN Biopsy -- Will order coagulation studies to include PT/INR, PTT, and thrombin time. -- Patient has no other history of excessive bleeding during surgical procedures or dental work. --Patient is not currently taking any medications known to cause increased risk of bleeding. -- If no clear evidence of coagulopathy on the above studies we would recommend proceeding with her planned LEEP procedure. --Return to clinic pending the results of the above studies.  Orders Placed This Encounter  Procedures   CBC with Differential (Cancer Center Only)    Standing Status:   Future    Number of Occurrences:   1    Standing  Expiration Date:   09/16/2023   CMP (Cancer Center only)    Standing Status:   Future    Number of Occurrences:   1    Standing Expiration Date:   09/16/2023   APTT    Standing Status:   Future    Number of Occurrences:   1    Standing Expiration Date:   09/16/2023   Protime-INR    Standing Status:   Future    Number of Occurrences:   1    Standing Expiration Date:   09/16/2023   Thrombin time    Standing Status:   Future    Number of Occurrences:   1    Standing Expiration Date:   09/16/2023   Fibrinogen    Standing Status:   Future    Number of Occurrences:   1    Standing Expiration Date:   09/16/2023    All questions were answered. The patient knows to call the clinic with any problems, questions or concerns.  A total of more than 60 minutes were spent on this encounter with face-to-face time and non-face-to-face time, including preparing to see the patient, ordering tests and/or medications, counseling the patient and coordination of care as outlined above.   Ulysees Barns, MD Department of Hematology/Oncology [facility] Phone: [phone] Pager: [phone] Email: [email or web address]  09/26/2022 12:07 PM

## 2022-09-18 LAB — THROMBIN TIME: Thrombin Time: 19.8 s (ref 0.0–23.0)

## 2022-09-21 ENCOUNTER — Telehealth: Payer: Self-pay | Admitting: *Deleted

## 2022-09-21 NOTE — Telephone Encounter (Signed)
-----   Message from Ulysees Barns IV sent at 09/20/2022  6:32 PM EDT ----- Please let Courtney Daniels know that our full assessment of her blood did not show any concerning abnormalities. We did not detect any thrombophilia or coagulation disorders. It should be OK to proceed with her LEEP procedure, however there is always risk of bleeding with such procedures. ----- Message ----- From: Leory Plowman, Lab In [place] Sent: 09/16/2022   2:47 PM EDT To: Jaci Standard, MD

## 2022-09-21 NOTE — Telephone Encounter (Signed)
TCT patient regarding recent lab results. Spoke with pt. Advised that our full assessment of her blood did not show any concerning abnormalities. We did not detect any thrombophilia or coagulation disorders. It should be OK to proceed with her LEEP procedure, however there is always risk of bleeding with such procedures. Pt voiced understanding.

## 2022-10-05 ENCOUNTER — Encounter: Payer: Self-pay | Admitting: Obstetrics & Gynecology

## 2022-11-02 ENCOUNTER — Ambulatory Visit: Payer: BC Managed Care – PPO | Admitting: Obstetrics & Gynecology

## 2022-11-02 ENCOUNTER — Encounter: Payer: Self-pay | Admitting: Obstetrics & Gynecology

## 2022-11-02 VITALS — BP 145/86 | HR 77 | Ht 67.0 in | Wt 214.0 lb

## 2022-11-02 DIAGNOSIS — R87613 High grade squamous intraepithelial lesion on cytologic smear of cervix (HGSIL): Secondary | ICD-10-CM | POA: Diagnosis not present

## 2022-11-02 NOTE — Progress Notes (Signed)
Subjective:    Patient ID: Courtney Daniels, female    DOB: 28-May-1965, 57 y.o.   MRN: [id number]  HPI  Pap smear March 2024 showed: low-grade squamous intraepithelial neoplasia (LGSIL - encompassing HPV,mild dysplasia,CIN I).   Colpo biopsy   A. CERVIX, 3:00, BIOPSY:  Mild squamous dysplasia (LSIL, CIN-1)  Acute chronic cervicitis with squamous metaplasia   B. CERVIX, 7:00, BIOPSY:  Moderate to severe squamous dysplasia with mild dysplasia and HPV effect  (HSIL, CIN-2/3)  Chronic cervicitis with squamous metaplasia   C. CERVIX, 12:00, BIOPSY:  Focal moderate to severe squamous dysplasia (HSIL, CIN-2/3)  Acute and chronic endocervicitis with focal squamous metaplasia   D. ENDOCERVIX, CURETTAGE:  Scant mucohemorrhagic debris  Negative for endocervical epithelium   The prior pap showed atypical squamous cellularity of undetermined significance (ASCUS).  Prior cervical/vaginal disease: 2018 LSIP with a few cells suspicious for higher grade lesion and HPV positive--coplo that was satsifactrory with no cervical Bx taken and ECC negative.   Review of Systems  Constitutional: Negative.   Respiratory: Negative.    Cardiovascular: Negative.   Gastrointestinal: Negative.   Genitourinary: Negative.        Objective:   Physical Exam Vitals reviewed.  Constitutional:      General: She is not in acute distress.    Appearance: She is well-developed.  HENT:     Head: Normocephalic and atraumatic.  Eyes:     Conjunctiva/sclera: Conjunctivae normal.  Cardiovascular:     Rate and Rhythm: Normal rate.  Pulmonary:     Effort: Pulmonary effort is normal.  Skin:    General: Skin is warm and dry.  Neurological:     Mental Status: She is alert and oriented to person, place, and time.  Psychiatric:        Mood and Affect: Mood normal.    Vitals:   11/02/22 1004  BP: (!) 153/91  Pulse: 77  Weight: 214 lb (97.1 kg)  Height: 5\' 7"  (1.702 m)       Assessment & Plan:  57 yo  female with high grade dysplasia--consents to LEEP Cleared from Hematology secondary to bleeding after biopsy. Will use epinephrine at time of LEEP in hospital.    BP elevated--she is anxious at this appt.   Will continue to monitor  The following questions were answered  1.  Will you still perform the procedure at [facility] or in the [place] office? 2.  How long will the procedure take and will I be under anesthesia? 3.  How long will it take for my body to heal/recover from the procedure? 4.  Will I need to take time away from work day(s) and return back to work? 5. What is the cost and will my insurance cover it?  Do I need to get pre-approval from my insurance prior to having the procedure? 6. What do I need to do in preparation for the procedure?  Is fasting required the day before or stop drinking liquids, etc? 7. What should I expect after the procedure is done?   8.  Will this LEEP procedure fully take care of or prevent me from developing cervical cancer or repeated abnormal pap smears in the future? 9.  Will this LEEP procedure prevent the high grade disease from reoccurring or will this remain the same? 10. What is the expected prognosis after having the LEEP procedure?   11.  What is the benefit of having the procedure vs. not having the procedure done now?  12.  What is the success rate for this procedure and the effectiveness/longevity? 13.  Will I ever have to have this procedure done again?  25 minutes spent in patient encounter with review of all pap smears sicne 2018, discussing procedure, counseling and documentation.

## 2022-11-20 ENCOUNTER — Telehealth: Payer: Self-pay

## 2022-11-20 NOTE — Telephone Encounter (Signed)
Patient called to let me know she hasn't spoken to pre-op and wanted to verify her surgery was still scheduled for 11/27/22. Confirmed with patient that her surgery is scheduled w/ Dr. Penne Lash on 11/27/22, and she must arrive by 7:45 am. I advised patient to call [facility] and ask for the pre-op dept on 11/23/22 if she hasn't heard from anyone. Patient agreed.

## 2022-11-26 ENCOUNTER — Encounter (HOSPITAL_COMMUNITY): Payer: Self-pay | Admitting: Obstetrics & Gynecology

## 2022-11-26 ENCOUNTER — Other Ambulatory Visit: Payer: Self-pay | Admitting: Obstetrics & Gynecology

## 2022-11-26 ENCOUNTER — Other Ambulatory Visit: Payer: Self-pay

## 2022-11-26 NOTE — Progress Notes (Signed)
PCP - Agapito Games, MD Cardiologist - denies  PPM/ICD - denies   Chest x-ray - denies EKG - denies (n/a) Stress Test - denies ECHO - denies Cardiac Cath - denies  CPAP - denies  DM- denies  ASA/Blood Thinner Instructions:  n/a   ERAS Protcol - clears until 0700  COVID TEST- n/a  Anesthesia review: no  Patient verbally denies any shortness of breath, fever, cough and chest pain during phone call   -------------  SDW INSTRUCTIONS given:  Your procedure is scheduled on 11/1.  Report to [facility] Main Entrance "A" at 0730 A.M., and check in at the Admitting office.  Call this number if you have problems the morning of surgery:  [phone]   Remember:  Do not eat after midnight the night before your surgery  You may drink clear liquids until 0700 the morning of your surgery.   Clear liquids allowed are: Water, Non-Citrus Juices (without pulp), Carbonated Beverages, Clear Tea, Black Coffee Only, and Gatorade    Take these medicines the morning of surgery with A SIP OF WATER  Tylenol- PRN  As of today, STOP taking any Aspirin (unless otherwise instructed by your surgeon) Aleve, Naproxen, Ibuprofen, Motrin, Advil, Goody's, BC's, all herbal medications, fish oil, and all vitamins.                      Do not wear jewelry, make up, or nail polish            Do not wear lotions, powders, perfumes/colognes, or deodorant.            Do not shave 48 hours prior to surgery.  Men may shave face and neck.            Do not bring valuables to the hospital.            [facility] is not responsible for any belongings or valuables.  Do NOT Smoke (Tobacco/Vaping) 24 hours prior to your procedure If you use a CPAP at night, you may bring all equipment for your overnight stay.   Contacts, glasses, dentures or bridgework may not be worn into surgery.      For patients admitted to the hospital, discharge time will be determined by your treatment team.   Patients  discharged the day of surgery will not be allowed to drive home, and someone needs to stay with them for 24 hours.    Special instructions:   [address]- Preparing For Surgery  Before surgery, you can play an important role. Because skin is not sterile, your skin needs to be as free of germs as possible. You can reduce the number of germs on your skin by washing with CHG (chlorahexidine gluconate) Soap before surgery.  CHG is an antiseptic cleaner which kills germs and bonds with the skin to continue killing germs even after washing.    Oral Hygiene is also important to reduce your risk of infection.  Remember - BRUSH YOUR TEETH THE MORNING OF SURGERY WITH YOUR REGULAR TOOTHPASTE  Please do not use if you have an allergy to CHG or antibacterial soaps. If your skin becomes reddened/irritated stop using the CHG.  Do not shave (including legs and underarms) for at least 48 hours prior to first CHG shower. It is OK to shave your face.  Please follow these instructions carefully.   Shower the NIGHT BEFORE SURGERY and the MORNING OF SURGERY with DIAL Soap.   Pat yourself dry with  a CLEAN TOWEL.  Wear CLEAN PAJAMAS to bed the night before surgery  Place CLEAN SHEETS on your bed the night of your first shower and DO NOT SLEEP WITH PETS.   Day of Surgery: Please shower morning of surgery  Wear Clean/Comfortable clothing the morning of surgery Do not apply any deodorants/lotions.   Remember to brush your teeth WITH YOUR REGULAR TOOTHPASTE.   Questions were answered. Patient verbalized understanding of instructions.

## 2022-11-26 NOTE — H&P (Signed)
Courtney Daniels is an 57 y.o. 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  Pt had colposcopy and biopsy in office which had markedly m ore bleeding than expected.  Pt was sent to hematology for work up and cleared.  Out of abundance of caution LEEP will be done in OR where there is more access to resources.    Pertinent Gynecological History: Menses: post-menopausal Bleeding: none Contraception: post menopausal status DES exposure: denies Blood transfusions: none Sexually transmitted diseases: no past history Previous GYN Procedures:  colposcopy    Last mammogram: normal Date: 05/15/22 Last pap: abnml with high grade dysplasia on biospy OB History: G4, P1121   Menstrual History: Patient's last menstrual period was 01/16/2013.    Past Medical History:  Diagnosis Date   BMI 33.0-33.9,adult 04/28/2018   Ectopic pregnancy 2005    Past Surgical History:  Procedure Laterality Date   LAPAROSCOPY Left    salpingectomy   TUBAL LIGATION  2005   Left only    Family History  Problem Relation Age of Onset   Healthy Sister    Healthy Sister    Cancer Brother 51       oropharyngeal cancer   Cataracts Maternal Grandmother    Diabetes Maternal Aunt    Diabetes Other        fathers side    Social History:  reports that she has never smoked. She has never used smokeless tobacco. She reports current alcohol use of about 2.0 standard drinks of alcohol per week. She reports that she does not use drugs.  Allergies: No Known Allergies  No medications prior to admission.    Review of Systems  Constitutional: Negative.   Respiratory: Negative.    Cardiovascular: Negative.   Gastrointestinal: Negative.   Genitourinary: Negative.     Height 5\' 7"  (1.702 m), weight 97.1 kg, last menstrual period 01/16/2013. Physical Exam Vitals reviewed.  Constitutional:      General: She is not in acute distress.    Appearance: She is well-developed.  HENT:      Head: Normocephalic and atraumatic.  Eyes:     Conjunctiva/sclera: Conjunctivae normal.  Neck:     Thyroid: No thyromegaly.  Cardiovascular:     Rate and Rhythm: Normal rate and regular rhythm.  Pulmonary:     Effort: Pulmonary effort is normal.     Breath sounds: Normal breath sounds.  Abdominal:     Palpations: Abdomen is soft.     Tenderness: There is no abdominal tenderness. There is no guarding or rebound.  Genitourinary:    Comments: Speculum exam to be repeated in OR Musculoskeletal:        General: No tenderness. Normal range of motion.     Cervical back: Neck supple.  Skin:    General: Skin is warm and dry.  Neurological:     Mental Status: She is alert and oriented to person, place, and time.  Psychiatric:        Mood and Affect: Mood normal.        Behavior: Behavior normal.      Assessment/Plan: 57 yo female with high grade dysplasia on biopsy needign LEEP.  Excessive bleedign during outpatient biopsy so proceeding with inpatient LEEP.   Pt understands risk of the procedure include but not limited to bleeding and infection.  Procedure is meant to be diagnostic and curative.  Will base next steps on pathology results.    Elsie Lincoln 11/26/2022, 11:30 AM

## 2022-11-27 ENCOUNTER — Ambulatory Visit (HOSPITAL_COMMUNITY): Payer: BC Managed Care – PPO | Admitting: Anesthesiology

## 2022-11-27 ENCOUNTER — Ambulatory Visit (HOSPITAL_COMMUNITY)
Admission: RE | Admit: 2022-11-27 | Discharge: 2022-11-27 | Disposition: A | Discharge status: Home / Self Care | Payer: BC Managed Care – PPO | Attending: Obstetrics & Gynecology | Admitting: Obstetrics & Gynecology

## 2022-11-27 ENCOUNTER — Encounter (HOSPITAL_COMMUNITY)
Admission: RE | Disposition: A | Discharge status: Home / Self Care | Payer: Self-pay | Source: Home / Self Care | Attending: Obstetrics & Gynecology

## 2022-11-27 ENCOUNTER — Other Ambulatory Visit: Payer: Self-pay

## 2022-11-27 ENCOUNTER — Encounter (HOSPITAL_COMMUNITY): Payer: Self-pay | Admitting: Obstetrics & Gynecology

## 2022-11-27 DIAGNOSIS — Z8759 Personal history of other complications of pregnancy, childbirth and the puerperium: Secondary | ICD-10-CM | POA: Diagnosis not present

## 2022-11-27 DIAGNOSIS — Z90721 Acquired absence of ovaries, unilateral: Secondary | ICD-10-CM | POA: Insufficient documentation

## 2022-11-27 DIAGNOSIS — D069 Carcinoma in situ of cervix, unspecified: Secondary | ICD-10-CM | POA: Insufficient documentation

## 2022-11-27 DIAGNOSIS — R87613 High grade squamous intraepithelial lesion on cytologic smear of cervix (HGSIL): Secondary | ICD-10-CM

## 2022-11-27 DIAGNOSIS — R87623 High grade squamous intraepithelial lesion on cytologic smear of vagina (HGSIL): Secondary | ICD-10-CM | POA: Diagnosis present

## 2022-11-27 HISTORY — PX: LEEP: SHX91

## 2022-11-27 LAB — TYPE AND SCREEN
ABO/RH(D): B POS
Antibody Screen: NEGATIVE

## 2022-11-27 LAB — CBC
HCT: 45.4 % (ref 36.0–46.0)
Hemoglobin: 13.7 g/dL (ref 12.0–15.0)
MCH: 25.8 pg — ABNORMAL LOW (ref 26.0–34.0)
MCHC: 30.2 g/dL (ref 30.0–36.0)
MCV: 85.7 fL (ref 80.0–100.0)
Platelets: 321 10*3/uL (ref 150–400)
RBC: 5.3 MIL/uL — ABNORMAL HIGH (ref 3.87–5.11)
RDW: 14 % (ref 11.5–15.5)
WBC: 5.8 10*3/uL (ref 4.0–10.5)
nRBC: 0 % (ref 0.0–0.2)

## 2022-11-27 LAB — ABO/RH: ABO/RH(D): B POS

## 2022-11-27 SURGERY — LEEP (LOOP ELECTROSURGICAL EXCISION PROCEDURE)
Anesthesia: General

## 2022-11-27 MED ORDER — ACETAMINOPHEN 500 MG PO TABS
1000.0000 mg | ORAL_TABLET | Freq: Once | ORAL | Status: AC
  Administered 2022-11-27: 1000 mg via ORAL
  Filled 2022-11-27: qty 2

## 2022-11-27 MED ORDER — PROPOFOL 10 MG/ML IV BOLUS
INTRAVENOUS | Status: AC
  Filled 2022-11-27: qty 20

## 2022-11-27 MED ORDER — FENTANYL CITRATE (PF) 250 MCG/5ML IJ SOLN
INTRAMUSCULAR | Status: AC
  Filled 2022-11-27: qty 5

## 2022-11-27 MED ORDER — LIDOCAINE 2% (20 MG/ML) 5 ML SYRINGE
INTRAMUSCULAR | Status: DC | PRN
  Administered 2022-11-27: 80 mg via INTRAVENOUS

## 2022-11-27 MED ORDER — ACETAMINOPHEN 500 MG PO TABS: 500.0000 mg | ORAL_TABLET | Freq: Four times a day (QID) | ORAL | 0 refills | Status: DC | PRN

## 2022-11-27 MED ORDER — IODINE STRONG (LUGOLS) 5 % PO SOLN
ORAL | Status: AC
  Filled 2022-11-27: qty 1

## 2022-11-27 MED ORDER — MONSELS FERRIC SUBSULFATE EX SOLN
CUTANEOUS | Status: AC
  Filled 2022-11-27: qty 8

## 2022-11-27 MED ORDER — FENTANYL CITRATE (PF) 250 MCG/5ML IJ SOLN
INTRAMUSCULAR | Status: DC | PRN
  Administered 2022-11-27: 50 ug via INTRAVENOUS

## 2022-11-27 MED ORDER — ONDANSETRON HCL 4 MG/2ML IJ SOLN
INTRAMUSCULAR | Status: DC | PRN
  Administered 2022-11-27: 4 mg via INTRAVENOUS

## 2022-11-27 MED ORDER — LACTATED RINGERS IV SOLN: INTRAVENOUS | Status: DC | PRN

## 2022-11-27 MED ORDER — LIDOCAINE-EPINEPHRINE 1 %-1:100000 IJ SOLN
INTRAMUSCULAR | Status: AC
  Filled 2022-11-27: qty 1

## 2022-11-27 MED ORDER — ONDANSETRON HCL 4 MG/2ML IJ SOLN: 4.0000 mg | Freq: Once | INTRAMUSCULAR | Status: DC | PRN

## 2022-11-27 MED ORDER — CHLORHEXIDINE GLUCONATE 0.12 % MT SOLN
OROMUCOSAL | Status: AC
  Administered 2022-11-27: 15 mL
  Filled 2022-11-27: qty 15

## 2022-11-27 MED ORDER — MONSELS FERRIC SUBSULFATE EX SOLN
CUTANEOUS | Status: DC | PRN
  Administered 2022-11-27: 1 via TOPICAL

## 2022-11-27 MED ORDER — NAPROXEN SODIUM 220 MG PO TABS: ORAL_TABLET | ORAL | 1 refills | Status: DC

## 2022-11-27 MED ORDER — DEXTROSE-SODIUM CHLORIDE 5-0.9 % IV SOLN: INTRAVENOUS | Status: DC

## 2022-11-27 MED ORDER — DEXAMETHASONE SODIUM PHOSPHATE 10 MG/ML IJ SOLN
INTRAMUSCULAR | Status: DC | PRN
  Administered 2022-11-27: 10 mg via INTRAVENOUS

## 2022-11-27 MED ORDER — AMISULPRIDE (ANTIEMETIC) 5 MG/2ML IV SOLN: 10.0000 mg | Freq: Once | INTRAVENOUS | Status: DC | PRN

## 2022-11-27 MED ORDER — ACETIC ACID 5 % SOLN
Status: AC
  Filled 2022-11-27: qty 500

## 2022-11-27 MED ORDER — BUPIVACAINE-EPINEPHRINE 0.25% -1:200000 IJ SOLN: INTRAMUSCULAR | Status: DC | PRN

## 2022-11-27 MED ORDER — LIDOCAINE 2% (20 MG/ML) 5 ML SYRINGE
INTRAMUSCULAR | Status: AC
  Filled 2022-11-27: qty 5

## 2022-11-27 MED ORDER — PROPOFOL 10 MG/ML IV BOLUS
INTRAVENOUS | Status: DC | PRN
  Administered 2022-11-27: 160 mg via INTRAVENOUS

## 2022-11-27 MED ORDER — FENTANYL CITRATE (PF) 100 MCG/2ML IJ SOLN: 25.0000 ug | INTRAMUSCULAR | Status: DC | PRN

## 2022-11-27 MED ORDER — LIDOCAINE-EPINEPHRINE (PF) 1 %-1:200000 IJ SOLN
INTRAMUSCULAR | Status: DC | PRN
  Administered 2022-11-27: 10 mL

## 2022-11-27 MED ORDER — IODINE STRONG (LUGOLS) 5 % PO SOLN
ORAL | Status: DC | PRN
  Administered 2022-11-27: .1 mL via ORAL

## 2022-11-27 SURGICAL SUPPLY — 30 items
APL SWBSTK 6 STRL LF DISP (MISCELLANEOUS) ×1
APPLICATOR COTTON TIP 6 STRL (MISCELLANEOUS) ×1 IMPLANT
APPLICATOR COTTON TIP 6IN STRL (MISCELLANEOUS) ×1
ELECT BALL LEEP 5MM RED (ELECTRODE) IMPLANT
ELECT LEEP 2.0X0.8 R2008 (MISCELLANEOUS)
ELECT LOOP LEEP RND 10X10 YLW (CUTTING LOOP)
ELECT LOOP LEEP RND 15X12 GRN (CUTTING LOOP)
ELECT LOOP LEEP RND 20X12 WHT (CUTTING LOOP)
ELECT LOOP LEEP SQR 10X10 ORG (CUTTING LOOP)
ELECT REM PT RETURN 9FT ADLT (ELECTROSURGICAL) ×1
ELECTRODE LEEP 2.0X0.8 R2008 (MISCELLANEOUS) IMPLANT
ELECTRODE LOOP LP RND 10X10YLW (CUTTING LOOP) IMPLANT
ELECTRODE LOOP LP RND 15X12GRN (CUTTING LOOP) IMPLANT
ELECTRODE LOOP LP RND 20X12WHT (CUTTING LOOP) IMPLANT
ELECTRODE LOOP LP SQR 10X10ORG (CUTTING LOOP) IMPLANT
ELECTRODE REM PT RTRN 9FT ADLT (ELECTROSURGICAL) ×1 IMPLANT
EXCISOR BIOPSY FISCHER CONE LG (MISCELLANEOUS) IMPLANT
EXTENDER ELECT LOOP LEEP 10CM (CUTTING LOOP) IMPLANT
GLOVE BIO SURGEON STRL SZ7 (GLOVE) ×1 IMPLANT
GLOVE BIOGEL PI IND STRL 7.0 (GLOVE) ×2 IMPLANT
GOWN STRL REUS W/ TWL LRG LVL3 (GOWN DISPOSABLE) ×2 IMPLANT
GOWN STRL REUS W/TWL LRG LVL3 (GOWN DISPOSABLE) ×2
HEMOSTAT SURGICEL 4X8 (HEMOSTASIS) ×1 IMPLANT
KIT TURNOVER KIT B (KITS) ×1 IMPLANT
NS IRRIG 1000ML POUR BTL (IV SOLUTION) ×1 IMPLANT
PACK VAGINAL MINOR WOMEN LF (CUSTOM PROCEDURE TRAY) ×1 IMPLANT
PAD OB MATERNITY 4.3X12.25 (PERSONAL CARE ITEMS) ×1 IMPLANT
PENCIL SMOKE EVACUATOR (MISCELLANEOUS) ×1 IMPLANT
SCOPETTES 8 STERILE (MISCELLANEOUS) ×2 IMPLANT
TOWEL GREEN STERILE FF (TOWEL DISPOSABLE) ×2 IMPLANT

## 2022-11-27 NOTE — Anesthesia Procedure Notes (Signed)
Procedure Name: LMA Insertion Date/Time: 11/27/2022 10:34 AM  Performed by: Allyn Kenner, CRNAPre-anesthesia Checklist: Patient identified, Emergency Drugs available, Suction available and Patient being monitored Patient Re-evaluated:Patient Re-evaluated prior to induction Oxygen Delivery Method: Circle System Utilized Preoxygenation: Pre-oxygenation with 100% oxygen Induction Type: IV induction Ventilation: Mask ventilation without difficulty LMA: LMA inserted LMA Size: 4.0 Number of attempts: 1 Airway Equipment and Method: Bite block Placement Confirmation: positive ETCO2 Tube secured with: Tape Dental Injury: Teeth and Oropharynx as per pre-operative assessment

## 2022-11-27 NOTE — Anesthesia Postprocedure Evaluation (Signed)
Anesthesia Post Note  Patient: Courtney Daniels  Procedure(s) Performed: LOOP ELECTROSURGICAL EXCISION PROCEDURE (LEEP)     Patient location during evaluation: PACU Anesthesia Type: General Level of consciousness: awake and alert Pain management: pain level controlled Vital Signs Assessment: post-procedure vital signs reviewed and stable Respiratory status: spontaneous breathing, nonlabored ventilation, respiratory function stable and patient connected to nasal cannula oxygen Cardiovascular status: blood pressure returned to baseline and stable Postop Assessment: no apparent nausea or vomiting Anesthetic complications: no   There were no known notable events for this encounter.  Last Vitals:  Vitals:   11/27/22 1130 11/27/22 1142  BP: 130/81 131/81  Pulse: 69 72  Resp: 14 20  Temp:  36.4 C  SpO2: 97% 96%    Last Pain:  Vitals:   11/27/22 1130  TempSrc:   PainSc: 0-No pain                 Collene Schlichter

## 2022-11-27 NOTE — Transfer of Care (Signed)
Immediate Anesthesia Transfer of Care Note  Patient: Courtney Daniels  Procedure(s) Performed: LOOP ELECTROSURGICAL EXCISION PROCEDURE (LEEP)  Patient Location: PACU  Anesthesia Type:General  Level of Consciousness: awake, alert , and oriented  Airway & Oxygen Therapy: Patient Spontanous Breathing and Patient connected to nasal cannula oxygen  Post-op Assessment: Report given to RN and Post -op Vital signs reviewed and stable  Post vital signs: Reviewed and stable  Last Vitals:  Vitals Value Taken Time  BP 135/82 11/27/22 1115  Temp 36.3 C 11/27/22 1112  Pulse 79 11/27/22 1116  Resp 15 11/27/22 1116  SpO2 99 % 11/27/22 1116  Vitals shown include unfiled device data.  Last Pain:  Vitals:   11/27/22 1112  TempSrc:   PainSc: 0-No pain         Complications: No notable events documented.

## 2022-11-27 NOTE — Anesthesia Preprocedure Evaluation (Addendum)
Anesthesia Evaluation  Patient identified by MRN, date of birth, ID band Patient awake    Reviewed: Allergy & Precautions, NPO status , Patient's Chart, lab work & pertinent test results  Airway Mallampati: II  TM Distance: >3 FB Neck ROM: Full    Dental  (+) Teeth Intact, Dental Advisory Given, Implants   Pulmonary neg pulmonary ROS   Pulmonary exam normal breath sounds clear to auscultation       Cardiovascular negative cardio ROS Normal cardiovascular exam Rhythm:Regular Rate:Normal     Neuro/Psych negative neurological ROS     GI/Hepatic negative GI ROS, Neg liver ROS,,,  Endo/Other  Obesity   Renal/GU negative Renal ROS     Musculoskeletal negative musculoskeletal ROS (+)    Abdominal   Peds  Hematology negative hematology ROS (+)   Anesthesia Other Findings Day of surgery medications reviewed with the patient.  Reproductive/Obstetrics High grade HSIL                             Anesthesia Physical Anesthesia Plan  ASA: 2  Anesthesia Plan: General   Post-op Pain Management: Tylenol PO (pre-op)* and Toradol IV (intra-op)*   Induction: Intravenous  PONV Risk Score and Plan: 4 or greater and Midazolam, Dexamethasone and Ondansetron  Airway Management Planned: LMA  Additional Equipment:   Intra-op Plan:   Post-operative Plan: Extubation in OR  Informed Consent: I have reviewed the patients History and Physical, chart, labs and discussed the procedure including the risks, benefits and alternatives for the proposed anesthesia with the patient or authorized representative who has indicated his/her understanding and acceptance.     Dental advisory given  Plan Discussed with: CRNA  Anesthesia Plan Comments:        Anesthesia Quick Evaluation

## 2022-11-27 NOTE — Op Note (Signed)
PRE-OPERATIVE DIAGNOSIS:  High grade HSIL  POST-OPERATIVE DIAGNOSIS:  High grade HSIL  PROCEDURE:  Procedure(s): LOOP ELECTROSURGICAL EXCISION PROCEDURE (LEEP) (N/A)  SURGEON:  Surgeons and Role:    * Lesly Dukes, MD - Primary  PHYSICIAN ASSISTANT: none  ASSISTANTS: none   ANESTHESIA:   general  EBL:  minimal   BLOOD ADMINISTERED:none  DRAINS: none   LOCAL MEDICATIONS USED:  LIDOCAINE   SPECIMEN:  Source of Specimen:  cervical cone biopsy  DISPOSITION OF SPECIMEN:  PATHOLOGY  COUNTS:  YES-Correct  PLAN OF CARE: Discharge to home after PACU  PATIENT DISPOSITION:  PACU - hemodynamically stable.  LEEP PROCEDURE NOTE Pap smear and colposcopy reviewed.   Benefits, alternatives, and limitations of procedure explained to patient, including pain, bleeding, infection, failure to remove abnormal tissue and failure to cure dysplasia, need for repeat procedures, damage to pelvic organs, cervical incompetence.  Role of HPV,cervical dysplasia and need for close followup was empasized. Informed written consent was obtained. All questions were answered. Time out performed.  ??Procedure: The patient was placed in lithotomy position and the bivalved coated speculum was placed in the patient's vagina. A grounding pad placed on the patient. Lugol's solution was applied to the cervix and areas of decreased uptake were noted several areas on cervix  Local anesthesia was administered via an intracervical block using 10cc of 1% Lidocaine with epinephrine. The suction was turned on and the Medium 1X Fisher Cone Biopsy Excisor on 50 Watts of cutting current was used to excise the area of decreased uptake and excise the entire transformation zone. Excellent hemostasis was achieved using roller ball coagulation set at 50 Watts blend current. Monsel's solution was then applied and the speculum was removed from the vagina. Specimens were sent to pathology. ?The patient tolerated the procedure well.  Post-operative instructions given to patient, including instruction to seek medical attention for persistent bright red bleeding, fever, abdominal/pelvic pain, dysuria, nausea or vomiting. She was also told about the possibility of having copious yellow to black tinged discharge. She was counseled to avoid anything in the vagina (sex/douching/tampons) for 4 weeks. She has a  3 week post-operative check to review results and assess wound healing.

## 2022-11-27 NOTE — Brief Op Note (Signed)
11/27/2022  11:07 AM  PATIENT:  Wilfred Lacy  57 y.o. female  PRE-OPERATIVE DIAGNOSIS:  High grade HSIL  POST-OPERATIVE DIAGNOSIS:  High grade HSIL  PROCEDURE:  Procedure(s): LOOP ELECTROSURGICAL EXCISION PROCEDURE (LEEP) (N/A)  SURGEON:  Surgeons and Role:    * Lesly Dukes, MD - Primary  PHYSICIAN ASSISTANT: none  ASSISTANTS: none   ANESTHESIA:   general  EBL:  minimal  BLOOD ADMINISTERED:none  DRAINS: none   LOCAL MEDICATIONS USED:  LIDOCAINE   SPECIMEN:  Source of Specimen:  cervical cone biopsy  DISPOSITION OF SPECIMEN:  PATHOLOGY  COUNTS:  YES  TOURNIQUET:  * No tourniquets in log *  DICTATION: .Note written in EPIC  PLAN OF CARE: Discharge to home after PACU  PATIENT DISPOSITION:  PACU - hemodynamically stable.   Delay start of Pharmacological VTE agent (>24hrs) due to surgical blood loss or risk of bleeding: not applicable

## 2022-11-30 LAB — SURGICAL PATHOLOGY

## 2022-12-01 ENCOUNTER — Encounter (HOSPITAL_COMMUNITY): Payer: Self-pay | Admitting: Obstetrics & Gynecology

## 2022-12-28 ENCOUNTER — Encounter: Payer: Self-pay | Admitting: Obstetrics & Gynecology

## 2022-12-28 ENCOUNTER — Ambulatory Visit (INDEPENDENT_AMBULATORY_CARE_PROVIDER_SITE_OTHER): Payer: BC Managed Care – PPO | Admitting: Obstetrics & Gynecology

## 2022-12-28 VITALS — BP 132/86 | HR 71 | Ht 67.0 in | Wt 214.0 lb

## 2022-12-28 DIAGNOSIS — R87613 High grade squamous intraepithelial lesion on cytologic smear of cervix (HGSIL): Secondary | ICD-10-CM | POA: Diagnosis not present

## 2022-12-28 NOTE — Progress Notes (Signed)
   Subjective:    Patient ID: Courtney Daniels, female    DOB: 1965/03/08, 57 y.o.   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  Pathology below.  Ectocervical margins burned with cautery at end of procedure.   A. 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   Respiratory: Negative.    Cardiovascular: Negative.   Gastrointestinal: Negative.   Genitourinary: Negative.        Bleeding and discharge resolved       Objective:   Physical Exam Vitals reviewed.  Constitutional:      General: She is not in acute distress.    Appearance: She is well-developed.  HENT:     Head: Normocephalic and atraumatic.  Eyes:     Conjunctiva/sclera: Conjunctivae normal.  Cardiovascular:     Rate and Rhythm: Normal rate.  Pulmonary:     Effort: Pulmonary effort is normal.  Genitourinary:    Comments: Cervix well healed Scant brown discharge at os.  Skin:    General: Skin is warm and dry.  Neurological:     Mental Status: She is alert and oriented to person, place, and time.  Psychiatric:        Mood and Affect: Mood normal.    Vitals:   12/28/22 1033  BP: 132/86  Pulse: 71  Weight: 214 lb (97.1 kg)  Height: 5\' 7"  (1.702 m)      Assessment & Plan:   57 yo female s/p LEEP--healing well  Pap in 6 months. Discussion about HPV, clearing the virus, recurrence

## 2023-05-20 ENCOUNTER — Other Ambulatory Visit: Payer: Self-pay | Admitting: Family Medicine

## 2023-05-20 DIAGNOSIS — Z1231 Encounter for screening mammogram for malignant neoplasm of breast: Secondary | ICD-10-CM

## 2023-05-27 ENCOUNTER — Ambulatory Visit (INDEPENDENT_AMBULATORY_CARE_PROVIDER_SITE_OTHER): Payer: Self-pay | Admitting: Family Medicine

## 2023-05-27 ENCOUNTER — Encounter: Payer: Self-pay | Admitting: Family Medicine

## 2023-05-27 VITALS — BP 130/75 | HR 75 | Ht 67.0 in | Wt 211.0 lb

## 2023-05-27 DIAGNOSIS — Z9889 Other specified postprocedural states: Secondary | ICD-10-CM | POA: Insufficient documentation

## 2023-05-27 DIAGNOSIS — Z0000 Encounter for general adult medical examination without abnormal findings: Secondary | ICD-10-CM

## 2023-05-27 NOTE — Progress Notes (Addendum)
 Complete physical exam  Patient: Courtney Daniels   DOB: 1965-06-21   58 y.o. Female  MRN: [id number]  Subjective:    Chief Complaint  Patient presents with   Annual Exam    Courtney Daniels is a 58 y.o. female who presents today for a complete physical exam. She reports consuming a general diet.  She generally feels well. She reports sleeping fairly well. She does not have additional problems to discuss today.   Mammo scheduled for next months.      Most recent fall risk assessment:    05/27/2023    8:43 AM  Fall Risk   Falls in the past year? 0  Number falls in past yr: 0  Injury with Fall? 0  Risk for fall due to : No Fall Risks  Follow up Falls evaluation completed     Most recent depression screenings:    05/27/2023    8:43 AM 04/20/2022    1:52 PM  PHQ 2/9 Scores  PHQ - 2 Score 0 0         Patient Care Team: Cydney Draft, MD as PCP - General (Family Medicine)   Outpatient Medications Prior to Visit  Medication Sig   [DISCONTINUED] acetaminophen  (TYLENOL ) 500 MG tablet Take 1 tablet (500 mg total) by mouth every 6 (six) hours as needed for moderate pain (pain score 4-6).   [DISCONTINUED] naproxen  sodium (ALEVE ) 220 MG tablet Resume as needed   No facility-administered medications prior to visit.    ROS        Objective:     BP 130/75   Pulse 75   Ht 5\' 7"  (1.702 m)   Wt 211 lb (95.7 kg)   LMP 01/16/2013   SpO2 98%   BMI 33.05 kg/m     Physical Exam Exam conducted with a chaperone present.  Constitutional:      Appearance: Normal appearance.  HENT:     Head: Normocephalic and atraumatic.     Right Ear: Tympanic membrane, ear canal and external ear normal.     Left Ear: Tympanic membrane, ear canal and external ear normal.     Nose: Nose normal.     Mouth/Throat:     Pharynx: Oropharynx is clear.  Eyes:     Extraocular Movements: Extraocular movements intact.     Conjunctiva/sclera: Conjunctivae normal.     Pupils: Pupils are  equal, round, and reactive to light.  Neck:     Thyroid: No thyromegaly.  Cardiovascular:     Rate and Rhythm: Normal rate and regular rhythm.  Pulmonary:     Effort: Pulmonary effort is normal.     Breath sounds: Normal breath sounds.  Chest:     Chest wall: No mass.  Breasts:    Right: Normal. No mass, nipple discharge or skin change.     Left: Normal. No mass, nipple discharge or skin change.  Abdominal:     General: Bowel sounds are normal.     Palpations: Abdomen is soft.     Tenderness: There is no abdominal tenderness.  Musculoskeletal:        General: No swelling.     Cervical back: Neck supple.  Lymphadenopathy:     Upper Body:     Right upper body: No supraclavicular, axillary or pectoral adenopathy.     Left upper body: No supraclavicular, axillary or pectoral adenopathy.  Skin:    General: Skin is warm and dry.  Neurological:     Mental Status: She  is oriented to person, place, and time.  Psychiatric:        Mood and Affect: Mood normal.        Behavior: Behavior normal.      No results found for any visits on 05/27/23.      Assessment & Plan:    Routine Health Maintenance and Physical Exam  Immunization History  Administered Date(s) Administered   Influenza Whole 01/08/2009, 01/08/2010   Influenza, Seasonal, Injecte, Preservative Fre 01/15/2012   Influenza,inj,Quad PF,6+ Mos 10/28/2012, 02/07/2014, 02/11/2015, 11/10/2019   Janssen (J&J) SARS-COV-2 Vaccination 05/03/2019   Moderna Sars-Covid-2 Vaccination 02/27/2020   Tdap 02/07/2014    Health Maintenance  Topic Date Due   COVID-19 Vaccine (3 - 2024-25 season) 09/27/2022   Zoster Vaccines- Shingrix (1 of 2) 07/21/2023 (Originally 07/25/2014)   INFLUENZA VACCINE  08/27/2023   DTaP/Tdap/Td (2 - Td or Tdap) 02/08/2024   MAMMOGRAM  05/12/2024   Colonoscopy  03/03/2025   Cervical Cancer Screening (HPV/Pap Cotest)  04/20/2027   Hepatitis C Screening  Completed   HIV Screening  Completed   HPV  VACCINES  Aged Out   Meningococcal B Vaccine  Aged Out    Discussed health benefits of physical activity, and encouraged her to engage in regular exercise appropriate for her age and condition.  Problem List Items Addressed This Visit       Other   S/P LEEP   Other Visit Diagnoses       Wellness examination    -  Primary   Relevant Orders   TSH   Lipid Panel With LDL/HDL Ratio   CMP14+EGFR   CBC with Differential       Keep up a regular exercise program and make sure you are eating a healthy diet Try to eat 4 servings of dairy a day, or if you are lactose intolerant take a calcium with vitamin D daily.  Declined PNA vaccine and Shingrix.    Return in about 1 year (around 05/26/2024) for Wellness Exam.     Duaine German, MD

## 2023-05-28 ENCOUNTER — Encounter: Payer: Self-pay | Admitting: Family Medicine

## 2023-05-28 LAB — CBC WITH DIFFERENTIAL/PLATELET
Basophils Absolute: 0.1 10*3/uL (ref 0.0–0.2)
Basos: 1 %
EOS (ABSOLUTE): 0.1 10*3/uL (ref 0.0–0.4)
Eos: 2 %
Hematocrit: 42.3 % (ref 34.0–46.6)
Hemoglobin: 13.5 g/dL (ref 11.1–15.9)
Immature Grans (Abs): 0 10*3/uL (ref 0.0–0.1)
Immature Granulocytes: 0 %
Lymphocytes Absolute: 2.8 10*3/uL (ref 0.7–3.1)
Lymphs: 59 %
MCH: 26.5 pg — ABNORMAL LOW (ref 26.6–33.0)
MCHC: 31.9 g/dL (ref 31.5–35.7)
MCV: 83 fL (ref 79–97)
Monocytes Absolute: 0.4 10*3/uL (ref 0.1–0.9)
Monocytes: 7 %
Neutrophils Absolute: 1.5 10*3/uL (ref 1.4–7.0)
Neutrophils: 31 %
Platelets: 358 10*3/uL (ref 150–450)
RBC: 5.1 x10E6/uL (ref 3.77–5.28)
RDW: 12.4 % (ref 11.7–15.4)
WBC: 4.8 10*3/uL (ref 3.4–10.8)

## 2023-05-28 LAB — CMP14+EGFR
ALT: 20 IU/L (ref 0–32)
AST: 18 IU/L (ref 0–40)
Albumin: 4.4 g/dL (ref 3.8–4.9)
Alkaline Phosphatase: 128 IU/L — ABNORMAL HIGH (ref 44–121)
BUN/Creatinine Ratio: 16 (ref 9–23)
BUN: 14 mg/dL (ref 6–24)
Bilirubin Total: 0.3 mg/dL (ref 0.0–1.2)
CO2: 24 mmol/L (ref 20–29)
Calcium: 9.5 mg/dL (ref 8.7–10.2)
Chloride: 101 mmol/L (ref 96–106)
Creatinine, Ser: 0.9 mg/dL (ref 0.57–1.00)
Globulin, Total: 3 g/dL (ref 1.5–4.5)
Glucose: 91 mg/dL (ref 70–99)
Potassium: 4.4 mmol/L (ref 3.5–5.2)
Sodium: 140 mmol/L (ref 134–144)
Total Protein: 7.4 g/dL (ref 6.0–8.5)
eGFR: 74 mL/min/{1.73_m2} (ref >59)

## 2023-05-28 LAB — LIPID PANEL WITH LDL/HDL RATIO
Cholesterol, Total: 148 mg/dL (ref 100–199)
HDL: 58 mg/dL (ref >39)
LDL Chol Calc (NIH): 71 mg/dL (ref 0–99)
LDL/HDL Ratio: 1.2 ratio (ref 0.0–3.2)
Triglycerides: 103 mg/dL (ref 0–149)
VLDL Cholesterol Cal: 19 mg/dL (ref 5–40)

## 2023-05-28 LAB — TSH: TSH: 1.7 u[IU]/mL (ref 0.450–4.500)

## 2023-05-28 NOTE — Progress Notes (Signed)
 HI [place],  Your metabolic panel is normal, except al phos just borderline elevated. Other liver enzymes look good. Will plan to recheck in 6 months. All other labs including cholesterol looks great!

## 2023-06-03 ENCOUNTER — Encounter (HOSPITAL_COMMUNITY): Payer: Self-pay

## 2023-06-10 ENCOUNTER — Ambulatory Visit: Payer: Self-pay

## 2023-06-10 DIAGNOSIS — Z1231 Encounter for screening mammogram for malignant neoplasm of breast: Secondary | ICD-10-CM

## 2023-06-15 ENCOUNTER — Ambulatory Visit: Payer: Self-pay | Admitting: Family Medicine

## 2023-06-15 NOTE — Progress Notes (Signed)
 Please call patient. Normal mammogram.  Repeat in 1 year.

## 2023-06-28 ENCOUNTER — Encounter: Payer: Self-pay | Admitting: Obstetrics & Gynecology

## 2023-06-28 ENCOUNTER — Ambulatory Visit: Admitting: Obstetrics & Gynecology

## 2023-06-28 ENCOUNTER — Other Ambulatory Visit (HOSPITAL_COMMUNITY)
Admission: RE | Admit: 2023-06-28 | Discharge: 2023-06-28 | Disposition: A | Discharge status: Home / Self Care | Source: Ambulatory Visit | Attending: Obstetrics & Gynecology | Admitting: Obstetrics & Gynecology

## 2023-06-28 VITALS — BP 132/83 | HR 74 | Ht 67.0 in | Wt 212.0 lb

## 2023-06-28 DIAGNOSIS — R87613 High grade squamous intraepithelial lesion on cytologic smear of cervix (HGSIL): Secondary | ICD-10-CM

## 2023-06-28 DIAGNOSIS — Z01419 Encounter for gynecological examination (general) (routine) without abnormal findings: Secondary | ICD-10-CM

## 2023-06-28 NOTE — Progress Notes (Signed)
   Subjective:     Courtney Daniels is a 58 y.o. female here for a routine exam.  Current complaints: none.     Gynecologic History Patient's last menstrual period was 01/16/2013. Contraception: post menopausal status Last pap smear (date and result):LEEP  Last mammogram (date and result):06/10/2023 WNL Last colon screening (date and result):Pt reported not due until 2026 Brush:yes Floss:yes Seatbelts: yes Sunscreen: yes   Obstetric History OB History  Gravida Para Term Preterm AB Living  4 1  1 2  0  SAB IAB Ectopic Multiple Live Births    1  1    # Outcome Date GA Lbr Len/2nd Weight Sex Type Anes PTL Lv  4 Gravida           3 AB           2 Ectopic           1 Preterm              The following portions of the patient's history were reviewed and updated as appropriate: allergies, current medications, past family history, past medical history, past social history, past surgical history, and problem list.  Review of Systems Pertinent items noted in HPI and remainder of comprehensive ROS otherwise negative.    Objective:   Vitals:   06/28/23 0930  BP: 132/83  Pulse: 74  Weight: 212 lb (96.2 kg)  Height: 5\' 7"  (1.702 m)   Vitals:  WNL General appearance: alert, cooperative and no distress  HEENT: Normocephalic, without obvious abnormality, atraumatic Eyes: negative Throat: lips, mucosa, and tongue normal; teeth and gums normal  Respiratory: Clear to auscultation bilaterally  CV: Regular rate and rhythm  Breasts:  Pt not undressed for exam--gets breast exams by PCP  GI: Soft, non-tender; bowel sounds normal; no masses,  no organomegaly  GU: External Genitalia:  Tanner V, no lesion Urethra:  No prolapse   Vagina: Pink, normal rugae, no blood or discharge  Cervix: No CMT, no lesion; external os stenotic  Uterus:  Normal size and contour, non tender, limited by habitus  Adnexa: Normal, no masses, non tender, limited by habitus  Musculoskeletal: No edema, redness or  tenderness in the calves or thighs  Skin: No lesions or rash  Lymphatic: Axillary adenopathy: none     Psychiatric: Normal mood and behavior      Assessment:    Healthy female exam.  S/p LEEP 11/27/22--focal high grade in back ground of low grade   Plan:    Pap with HPV Up to date with mammogram Up to date with colon cancer screening Sees PCP for all health maintenance except pap smears.

## 2023-07-05 ENCOUNTER — Ambulatory Visit: Payer: Self-pay | Admitting: Obstetrics & Gynecology

## 2023-07-05 LAB — CYTOLOGY - PAP
Comment: NEGATIVE
Diagnosis: NEGATIVE
High risk HPV: NEGATIVE

## 2023-09-24 ENCOUNTER — Ambulatory Visit: Admitting: Medical-Surgical

## 2024-05-29 ENCOUNTER — Encounter: Admitting: Family Medicine
